# Patient Record
Sex: Female | Born: 1985 | Race: White | Hispanic: Yes | Marital: Married | Smoking: Never smoker
Health system: Southern US, Community
[De-identification: ages and names within clinical notes are randomized; demographics above are authoritative.]

## PROBLEM LIST (undated history)

## (undated) ENCOUNTER — Inpatient Hospital Stay (HOSPITAL_COMMUNITY): Payer: Self-pay

## (undated) DIAGNOSIS — Z789 Other specified health status: Secondary | ICD-10-CM

## (undated) DIAGNOSIS — R42 Dizziness and giddiness: Secondary | ICD-10-CM

## (undated) HISTORY — PX: NO PAST SURGERIES: SHX2092

## (undated) HISTORY — DX: Dizziness and giddiness: R42

---

## 2005-11-19 ENCOUNTER — Inpatient Hospital Stay (HOSPITAL_COMMUNITY): Admission: AD | Admit: 2005-11-19 | Discharge: 2005-11-19 | Payer: Self-pay | Admitting: Obstetrics and Gynecology

## 2005-11-19 ENCOUNTER — Inpatient Hospital Stay (HOSPITAL_COMMUNITY): Admission: AD | Admit: 2005-11-19 | Discharge: 2005-11-21 | Payer: Self-pay | Admitting: Obstetrics & Gynecology

## 2005-11-19 ENCOUNTER — Ambulatory Visit: Payer: Self-pay | Admitting: Obstetrics and Gynecology

## 2006-12-16 ENCOUNTER — Emergency Department (HOSPITAL_COMMUNITY): Admission: EM | Admit: 2006-12-16 | Discharge: 2006-12-16 | Payer: Self-pay | Admitting: Emergency Medicine

## 2007-06-07 ENCOUNTER — Emergency Department (HOSPITAL_COMMUNITY): Admission: EM | Admit: 2007-06-07 | Discharge: 2007-06-07 | Payer: Self-pay | Admitting: Emergency Medicine

## 2007-07-28 ENCOUNTER — Inpatient Hospital Stay (HOSPITAL_COMMUNITY): Admission: AD | Admit: 2007-07-28 | Discharge: 2007-07-28 | Payer: Self-pay | Admitting: Obstetrics & Gynecology

## 2007-08-12 ENCOUNTER — Ambulatory Visit (HOSPITAL_COMMUNITY): Admission: RE | Admit: 2007-08-12 | Discharge: 2007-08-12 | Payer: Self-pay | Admitting: Obstetrics & Gynecology

## 2007-08-14 ENCOUNTER — Ambulatory Visit: Payer: Self-pay | Admitting: Obstetrics & Gynecology

## 2008-05-29 ENCOUNTER — Emergency Department (HOSPITAL_COMMUNITY): Admission: EM | Admit: 2008-05-29 | Discharge: 2008-05-29 | Payer: Self-pay | Admitting: Emergency Medicine

## 2008-06-09 ENCOUNTER — Ambulatory Visit: Payer: Self-pay | Admitting: Obstetrics and Gynecology

## 2008-06-18 ENCOUNTER — Ambulatory Visit (HOSPITAL_COMMUNITY): Admission: RE | Admit: 2008-06-18 | Discharge: 2008-06-18 | Payer: Self-pay | Admitting: Family Medicine

## 2008-07-07 ENCOUNTER — Ambulatory Visit: Payer: Self-pay | Admitting: Obstetrics & Gynecology

## 2008-07-27 ENCOUNTER — Ambulatory Visit: Payer: Self-pay | Admitting: Obstetrics & Gynecology

## 2008-07-27 ENCOUNTER — Ambulatory Visit (HOSPITAL_COMMUNITY): Admission: RE | Admit: 2008-07-27 | Discharge: 2008-07-27 | Payer: Self-pay | Admitting: Obstetrics & Gynecology

## 2008-08-18 ENCOUNTER — Ambulatory Visit: Payer: Self-pay | Admitting: Obstetrics and Gynecology

## 2009-02-21 ENCOUNTER — Ambulatory Visit (HOSPITAL_COMMUNITY): Admission: RE | Admit: 2009-02-21 | Discharge: 2009-02-21 | Payer: Self-pay | Admitting: *Deleted

## 2009-03-03 ENCOUNTER — Inpatient Hospital Stay (HOSPITAL_COMMUNITY): Admission: AD | Admit: 2009-03-03 | Discharge: 2009-03-03 | Payer: Self-pay | Admitting: Obstetrics & Gynecology

## 2009-03-03 ENCOUNTER — Ambulatory Visit: Payer: Self-pay | Admitting: Advanced Practice Midwife

## 2009-03-17 ENCOUNTER — Ambulatory Visit (HOSPITAL_COMMUNITY): Admission: RE | Admit: 2009-03-17 | Discharge: 2009-03-17 | Payer: Self-pay | Admitting: Obstetrics & Gynecology

## 2009-07-15 ENCOUNTER — Ambulatory Visit: Payer: Self-pay | Admitting: Family Medicine

## 2009-07-15 ENCOUNTER — Inpatient Hospital Stay (HOSPITAL_COMMUNITY): Admission: AD | Admit: 2009-07-15 | Discharge: 2009-07-17 | Payer: Self-pay | Admitting: Obstetrics & Gynecology

## 2010-05-25 ENCOUNTER — Emergency Department (HOSPITAL_COMMUNITY)
Admission: EM | Admit: 2010-05-25 | Discharge: 2010-05-25 | Disposition: A | Discharge status: Home / Self Care | Payer: Self-pay | Attending: Emergency Medicine | Admitting: Emergency Medicine

## 2010-05-25 DIAGNOSIS — R51 Headache: Secondary | ICD-10-CM | POA: Insufficient documentation

## 2010-05-25 DIAGNOSIS — M542 Cervicalgia: Secondary | ICD-10-CM | POA: Insufficient documentation

## 2010-06-06 LAB — CBC
HCT: 40.1 % (ref 36.0–46.0)
MCHC: 34.1 g/dL (ref 30.0–36.0)
MCV: 88.1 fL (ref 78.0–100.0)
Platelets: 152 10*3/uL (ref 150–400)
RBC: 4.55 MIL/uL (ref 3.87–5.11)

## 2010-06-06 LAB — RPR: RPR Ser Ql: NONREACTIVE

## 2010-06-19 LAB — URINALYSIS, ROUTINE W REFLEX MICROSCOPIC
Bilirubin Urine: NEGATIVE
Hgb urine dipstick: NEGATIVE
Ketones, ur: NEGATIVE mg/dL
Nitrite: NEGATIVE
Specific Gravity, Urine: 1.01 (ref 1.005–1.030)
pH: 6.5 (ref 5.0–8.0)

## 2010-06-27 LAB — CBC
Hemoglobin: 14.4 g/dL (ref 12.0–15.0)
MCHC: 35.1 g/dL (ref 30.0–36.0)
MCV: 87.9 fL (ref 78.0–100.0)
RBC: 4.67 MIL/uL (ref 3.87–5.11)
RDW: 13.6 % (ref 11.5–15.5)

## 2010-06-29 LAB — COMPREHENSIVE METABOLIC PANEL
Albumin: 3.9 g/dL (ref 3.5–5.2)
Alkaline Phosphatase: 49 U/L (ref 39–117)
BUN: 11 mg/dL (ref 6–23)
CO2: 24 mEq/L (ref 19–32)
Chloride: 107 mEq/L (ref 96–112)
Creatinine, Ser: 0.71 mg/dL (ref 0.4–1.2)
GFR calc non Af Amer: >60 mL/min (ref >60)
Potassium: 3.3 mEq/L — ABNORMAL LOW (ref 3.5–5.1)
Total Bilirubin: 0.7 mg/dL (ref 0.3–1.2)

## 2010-06-29 LAB — CBC
HCT: 40.3 % (ref 36.0–46.0)
Hemoglobin: 13.9 g/dL (ref 12.0–15.0)
RBC: 4.64 MIL/uL (ref 3.87–5.11)
RDW: 13.2 % (ref 11.5–15.5)
WBC: 10.2 10*3/uL (ref 4.0–10.5)

## 2010-06-29 LAB — URINALYSIS, ROUTINE W REFLEX MICROSCOPIC
Bilirubin Urine: NEGATIVE
Glucose, UA: NEGATIVE mg/dL
Hgb urine dipstick: NEGATIVE
Ketones, ur: NEGATIVE mg/dL
Protein, ur: NEGATIVE mg/dL
pH: 6 (ref 5.0–8.0)

## 2010-06-29 LAB — POCT PREGNANCY, URINE

## 2010-06-29 LAB — URINE MICROSCOPIC-ADD ON

## 2010-06-29 LAB — DIFFERENTIAL
Basophils Absolute: 0.1 10*3/uL (ref 0.0–0.1)
Basophils Relative: 1 % (ref 0–1)
Eosinophils Absolute: 0 10*3/uL (ref 0.0–0.7)
Monocytes Absolute: 0.4 10*3/uL (ref 0.1–1.0)
Monocytes Relative: 4 % (ref 3–12)
Neutrophils Relative %: 91 % — ABNORMAL HIGH (ref 43–77)

## 2010-06-29 LAB — WET PREP, GENITAL
Clue Cells Wet Prep HPF POC: NONE SEEN
WBC, Wet Prep HPF POC: NONE SEEN

## 2010-06-29 LAB — GC/CHLAMYDIA PROBE AMP, GENITAL

## 2010-08-01 NOTE — Group Therapy Note (Signed)
[name], CUMBER NO.:  [id number]   MEDICAL RECORD NO.:  [id number]          PATIENT TYPE:  WOC   LOCATION:  [facility]                   FACILITY:  [facility]   PHYSICIAN:  Argentina Donovan, MD        DATE OF BIRTH:  1985-12-18   DATE OF SERVICE:                                  CLINIC NOTE   The patient is a 25 year old gravida 1, para 1-0-0-1, Spanish-speaking  Hispanic female, who recently underwent laparoscopy with fulguration of  endometriosis.  Dr. Silas Flood wanted her placed on Depo-Lupron.  We are going  to have her fill out the papers and apply for 11.25 injection of that.  Meanwhile, we are going to start her on Sprintec.  I explained to her  with the translator, the disease the history the following of the  disease, and progression, and what we must do to try and control this.  She seems to understand.  She is not planning a pregnancy right away, so  we are going to continue her on oral contraceptives, i.e. Sprintec.  The  papers were filled out today.  On examination, her abdomen is soft,  flat, and nontender.  No masses or organomegaly.  Postop, the umbilicus  is healing well with a slight tenderness and a small scar area, which  will take some time to soften.  She also has a complaint of bloating  following meals.  I told her if this continues, we will try and get her  to a gastroenterologist.   IMPRESSION:  Postop fulguration for endometriosis.   PLAN:  Depo-Lupron.           ______________________________  Argentina Donovan, MD     PR/MEDQ  D:  08/18/2008  T:  08/19/2008  Job:  [id number]

## 2010-08-01 NOTE — Group Therapy Note (Signed)
[name], [name] NO.:  [id number]   MEDICAL RECORD NO.:  [id number]          PATIENT TYPE:  WOC   LOCATION:  [facility]                   FACILITY:  [facility]   PHYSICIAN:  Allie Bossier, MD        DATE OF BIRTH:  [birth year]   DATE OF SERVICE:  08/14/2007                                  CLINIC NOTE   CHIEF COMPLAINT:  Pelvic pain, referred from [facility].   HISTORY OF PRESENT ILLNESS:  This is a 25 year old gravida 1, para 1-0-0-  1 presenting as a followup from the maternity admissions unit, where she  was seen for pelvic pain.  Her assessment there included ultrasound  showing a complex left ovarian cyst that had since resolved on repeat  ultrasound on Aug 12, 2007.  Her [facility] visit was on Jul 30, 2007.  At that  visit, they did gonorrhea and Chlamydia cultures that were negative, wet  prep that was negative.  CBC with white blood cell count of 9.6.  The  patient reports that she has had right lower quadrant suprapubic pain  that started three months ago.  When it started, she was doing normal  housework.  It progressively worsened, and that night when she was  having intercourse, her pain was worse.  She describes it as sharp in  nature and squeezing.  It is constant and has increasing and decreasing  intensity.  It worsens with movements such as bending and moving to that  side.  She states that also after sitting for a while it hurts.  She  notes that early on in the course of this pain, she had dysuria, but now  she is urinating normally.  She does not complain of increased  frequency.  She notes no changes in her bowel movements and no nausea or  vomiting.  She did have an IUD that was placed.  It was removed on May  13th when she was seen in the [facility]U.   PAST MEDICAL HISTORY:  None.   PAST SURGICAL HISTORY:  None.   PAST OB/GYN HISTORY:  Gravida 1 with history of one spontaneous vaginal  delivery at term.   GYNECOLOGIC HISTORY:  Patient reports no abnormal  Pap's.   MEDICATIONS:  Patient is taking hydrocodone/acetaminophen as needed for  pain.   ALLERGIES:  No known drug allergies.   PHYSICAL EXAMINATION:  GENERAL:  This is a well-appearing female in no  distress.  VITALS:  Temperature 97.9, pulse 73, blood pressure 107/70.  Weight 125  pounds.  CARDIOVASCULAR:  Heart has a regular rate and rhythm.  Normal S1 and S2.  No murmurs, rubs or gallops appreciated.  RESPIRATORY:  Lungs are clear to auscultation bilaterally.  ABDOMEN:  Soft.  There is tenderness in the right lower quadrant, left  lower quadrant, and suprapubic regions to palpation.  There is no  rebound tenderness or guarding.  With flexion of the abdominal muscles,  there continues to be tenderness to palpation of the muscles.  She is  also tender over the suprapubic symphysis.  GENITOURINARY:  Normal female external genitalia.  Vaginal mucosa is  pink  and moist with a small amount of whitish discharge.  Cervix is  midline with a small amount of bleeding noted.  Specimens are collected  for wet prep and gonorrhea and Chlamydia.  On placement of the  GC/Chlamydia cervical swab, there is cervical motion tenderness in all  directions, specifically anteriorly into the right.  There is mild pain.  The uterus feels normal in size.  The adnexa palpate normally but are  tender to palpation on the right.   ASSESSMENT/PLAN:  This is a 25 year old gravida 1, para 1-0-0-1 with  pelvic pain x3 months.  She has a ultrasound showing a resolved left  ovarian cyst.  We will check wet prep and GC/Chlamydia today.  UA today  is negative.  We will give her a trial of oral contraceptive pills and  Motrin every 6 hours around the clock.  She is to follow up in two  months to see how she is doing with this current intervention.   Patient was discussed with Dr. Marice Potter, and she agrees with the plan.     ______________________________  Karlton Lemon, MD    ______________________________  Allie Bossier, MD    NS/MEDQ  D:  08/14/2007  T:  08/14/2007  Job:  [id number]

## 2010-08-01 NOTE — Op Note (Signed)
[name], [name]             ACCOUNT NO.:  [id number]   MEDICAL RECORD NO.:  [id number]          PATIENT TYPE:  AMB   LOCATION:  [facility]                           [facility]   PHYSICIAN:  Norton Blizzard, MD    DATE OF BIRTH:  1985/11/10   DATE OF PROCEDURE:  07/27/2008  DATE OF DISCHARGE:  07/27/2008                               OPERATIVE REPORT   PREOPERATIVE DIAGNOSIS:  Chronic pelvic pain.   POSTOPERATIVE DIAGNOSES:  1. Chronic pelvic pain.  2. Stage I endometriosis.   PROCEDURES:  1. Operative laparoscopy.  2. Fulguration of endometriosis.   SURGEON:  Norton Blizzard, MD   ANESTHESIA:  General.   IV FLUIDS:  600 mL of lactated Ringer's.   URINE OUTPUT:  75 mL.   ESTIMATED BLOOD LOSS:  Minimal.   INDICATIONS:  The patient is a 25 year old gravida 1, para 1 with a long  history of chronic pelvic pain, which was refractory to treatment with  oral contraceptives and Depo-Provera.  The patient reported that her  pain was interfering with her activities of daily living and she wanted  evaluation for possible endometriosis. Prior to surgery, the risks of  surgery including but not limited to bleeding, infection, injury to  surrounding organs, need for additional procedures including laparotomy  were discussed with the patient and written informed consent was  obtained.   FINDINGS:  Stage I endometriotic lesions, purple/black lesions and  abnormal vasculature involving the bilateral uterosacral ligaments and  posterior cul-de-sac.  Normal adnexal regions bilaterally.  Normal  uterus.  Normal upper abdomen.   SPECIMENS:  None.   COMPLICATIONS:  None immediately after the case.   DESCRIPTION OF PROCEDURE:  The patient received preoperative Ancef and  has compression devices placed on her lower extremities while in the  preoperative area.  She was then taken to the operating room where  general anesthesia were administered and found to be adequate.  She was  then  placed in the dorsal lithotomy position, and prepped and draped in  a sterile manner.  A Foley catheter was inserted into the patient's  bladder and attached to constant gravity and an uterine manipulator was  also placed at this point.  Attention was then turned to the patient's  abdomen where an umbilical incision was made with a scalpel and a Veress  needle was inserted.  The correct intraperitoneal placement was  confirmed with an opening pressure of 2 mmHg upon insufflation with  carbon dioxide gas.  The abdomen was insufflated to 15 mmHg and the  Veress needle was removed and a 10-mm trocar was placed.  A laparoscope  was then placed, which also confirmed correct intraperitoneal placement.  A detailed survey of the upper abdomen and pelvis was done and was  remarkable for some endometriosis black and purple lesions noted on  bilateral uterosacral ligaments.  There was also abnormal vasculature  and some endometriotic lesions in the posterior cul-de-sac.  At this  point, the operative laparoscope was then placed and the decision was  made to proceed with fulguration of these endometriotic lesions.  Using  the Kleppinger forceps and bipolar coagulation these lesions were  fulgurated successfully with care given to avoid ureteral injury; good  fulguration forceps of all lesions were noted.  The Kleppinger forceps  were then removed from the abdomen and the abdomen was desufflated.  All  instruments were then removed from the patient's abdomen.  The umbilical  incision was repaired with 0 Vicryl figure-of-eight stitch for the  fascial layer and a 4-0 Vicryl subcuticular stitch for the skin layer.  All instruments were removed from the patient's pelvis.  The patient  tolerated procedure well.  Sponge, instrument and needle counts were  correct x2.  She was taken to the recovery room awake, extubated and in  stable condition.   FOLLOWUP PLANS:  The patient had a scheduled postoperative  appointment  on August 18, 2008 at 12:45 p.m.  This appointment was communicated to the  patient.  She was told to come to the emergency room here for any  postoperative concerns and was given prescriptions for Percocet,  Ibuprofen, and Colace.  When she comes for her postoperative visit, the  patient will be a good candidate for Depo-Lupron with addback therapy  for management of her chronic pelvic pain and her current diagnosis of  stage I endometriosis, also given that her pain has been refractory to  treatment with hormonal modalities including Depo-Provera and oral  contraceptive pills.      Norton Blizzard, MD  Electronically Signed     UAD/MEDQ  D:  07/27/2008  T:  07/28/2008  Job:  [id number]

## 2010-08-01 NOTE — Group Therapy Note (Signed)
[name], BEEBE NO.:  [id number]   MEDICAL RECORD NO.:  [id number]          PATIENT TYPE:  WOC   LOCATION:  [facility]                   FACILITY:  [facility]   PHYSICIAN:  Argentina Donovan, MD        DATE OF BIRTH:  12-03-1985   DATE OF SERVICE:  06/09/2008                                  CLINIC NOTE   The patient is a 25 year old gravida 1 para 1001, __________and one  child 1-1/2 years ago, who is complaining of pelvic pain.  [facility]  She was seen here a year ago and had  developed an ovarian cyst, probably hemorrhagic, and was followed up six  weeks later, and had resolved.  She was on oral contraceptives up until  her exam in the [facility] in January, when she was switched from  oral contraceptives to Depo-lupron because of discoloration of her face.  Other than that, she has been in good health.   PHYSICAL EXAMINATION:  VITAL SIGNS:  Blood pressure is 108/71.  Her  weight is 121.  Her height is 60-1/2 inches.  Pulse is 80.  ABDOMEN: Is soft, flat, tender to deep palpation, without guarding or  rebound.  When I push on the right lower quadrant, she complains of pain  on the left.  When I push on the upper right and left quadrant, she  complains of pain in the right pelvis.  PELVIS:  On bimanual examination, the uterus is small normal size,  shape, and consistency, and retroverted.  Adnexa could not be well  outlined.  No cyst could be palpated.  Cul-de-sac was free with  exception of the fundus of the uterus, and the patient will get an  ultrasound and come back in two weeks for evaluation.   IMPRESSION:  Pelvic pain of unknown etiology.           ______________________________  Argentina Donovan, MD     PR/MEDQ  D:  06/09/2008  T:  06/09/2008  Job:  [id number]

## 2010-08-01 NOTE — Group Therapy Note (Signed)
[name], THREATS NO.:  [id number]   MEDICAL RECORD NO.:  [id number]          PATIENT TYPE:  WOC   LOCATION:  [facility]                   FACILITY:  [facility]   PHYSICIAN:  Johnella Moloney, MD        DATE OF BIRTH:  20-Jun-1985   DATE OF SERVICE:                                  CLINIC NOTE   The patient is a 25 year old, gravida 1, para 1, with a long history of  chronic pelvic pain.  The patient was last seen by Dr. Argentina Donovan on  June 09, 2008, and at this point the patient was noting that her  chronic pelvic pain has gotten worse.  Her pain has been treated with  oral contraceptives which did not help her pain or constipation, and  also Depo-Provera which did not help her pain.  The patient is wanting  to discuss further management.  She did have a hemorrhagic ovarian cyst  a year ago but does say that the pain that she is having now is worse  compared to the pain she had then.  Her pain now is constant and is  interfering with her activities of daily living to the point that she  cannot give her daughter a shower or do most things without having a lot  of pain.  She also has some irregular bleeding that is connected to this  pain.  She describes having some bleeding around the middle of her cycle  like spotting and then having a heavy period at the end of her cycle  which is characterized by a lot of pain.   PAST OBSTETRIC AND GYNECOLOGIC HISTORY:  One spontaneous vaginal  delivery.  The patient denies any abnormal Pap smears.  Her last Pap was  April 06, 2008 and was normal.  She denies any sexually transmitted  infections.   PAST MEDICAL HISTORY:  None.   PAST SURGICAL HISTORY:  None.   MEDICATIONS:  1. Vicodin as needed for pain.  2. Ibuprofen as needed for pain.  3. Depo-Provera.  She was due for a shot on June 28, 2008, but the      patient has not received this, as it is not helping her pain.   ALLERGIES:  No known drug allergies.  The patient  is not allergic to  latex.   REVIEW OF SYSTEMS:  Only remarkable for abdominal pain, especially on  the right side.   PHYSICAL EXAMINATION:  VITAL SIGNS:  Temperature 98.3, pulse 81, blood  pressure 118/82, respirations 24, weight 123.2 pounds, height 60.5  inches.  GENERAL:  No apparent distress.  LUNGS:  Clear to auscultation bilaterally.  HEART:  Regular rate and rhythm.  ABDOMEN:  Soft.  Tenderness in the right lower quadrant, left lower  quadrant and suprapubic regions.  No rebound or guarding.  The patient  continues to have pain even with flexion of abdominal muscles and is  also tender to palpation in the right side when you push on the upper  right or left quadrants.  PELVIC:  Deferred at this visit.  EXTREMITIES:  No cyanosis, clubbing or edema.   ASSESSMENT/PLAN:  The patient is a 25 year old, gravida 1, para 1, with  chronic pelvic pain that is refractory to hormonal medications.  The  patient could have endometriosis.  She was told that she would need a  diagnostic laparoscopy in order to arrive at the diagnosis.  If  diagnostic laparoscopy is done and endometriosis is diagnosed, she will  be as good candidate for Depo-Lupron.  However, if there is no  endometriosis seen on the diagnosis laparoscopy, the patient's pain  could be musculoskeletal or neuropathic, and she will need to either go  to a pain clinic or a neurologist for further evaluation.  Of note, the  patient has no urinary or gastrointestinal symptoms that are concerning  for either interstitial cystitis or irritable bowel syndrome.  The risks  of laparoscopy were discussed with the patient including bleeding,  infection, injury to surrounding organs and need for additional  procedures.  The patient was told to expect a call from the surgical  scheduler regarding a date and time of her surgery.  In the meantime,  she was given a refill of her Vicodin and also given a prescription for  diclofenac DR 75 mg  p.o. b.i.d. p.r.n. pain.  She was told to come to  the emergency room if her pain is not controlled on these pain  medications or for any other gynecologic concerns.  The patient was  given [employer] pamphlets that discussed laparoscopy and endometriosis, and  these pamphlets were written in Spanish.  The patient is primarily  Spanish speaking, and a Spanish interpreter was present during this  encounter.           ______________________________  Johnella Moloney, MD     UD/MEDQ  D:  07/07/2008  T:  07/07/2008  Job:  [id number]

## 2010-12-11 LAB — WET PREP, GENITAL

## 2010-12-11 LAB — I-STAT 8, (EC8 V) (CONVERTED LAB)
Chloride: 106
HCT: 45
Hemoglobin: 15.3 — ABNORMAL HIGH
Operator id: 265201
Potassium: 3.7
TCO2: 27
pCO2, Ven: 45.5
pH, Ven: 7.365 — ABNORMAL HIGH

## 2010-12-11 LAB — POCT PREGNANCY, URINE
Operator id: 284251
Preg Test, Ur: NEGATIVE

## 2010-12-11 LAB — CBC
HCT: 42.2
Hemoglobin: 14.2
MCV: 86.1
RBC: 4.9
WBC: 9.3

## 2010-12-11 LAB — URINALYSIS, ROUTINE W REFLEX MICROSCOPIC
Ketones, ur: NEGATIVE
Nitrite: NEGATIVE
Protein, ur: NEGATIVE
Urobilinogen, UA: 0.2
pH: 7

## 2010-12-11 LAB — POCT I-STAT CREATININE
Creatinine, Ser: 0.7
Operator id: 265201

## 2010-12-11 LAB — DIFFERENTIAL
Eosinophils Absolute: 0
Eosinophils Relative: 0
Lymphocytes Relative: 18
Lymphs Abs: 1.6
Monocytes Relative: 5

## 2010-12-13 LAB — POCT URINALYSIS DIP (DEVICE)
Glucose, UA: NEGATIVE
Hgb urine dipstick: NEGATIVE
Nitrite: NEGATIVE
Protein, ur: NEGATIVE
Specific Gravity, Urine: 1.01
Urobilinogen, UA: 0.2

## 2012-06-05 ENCOUNTER — Emergency Department (HOSPITAL_COMMUNITY): Payer: Self-pay

## 2012-06-05 ENCOUNTER — Encounter (HOSPITAL_COMMUNITY): Payer: Self-pay | Admitting: Emergency Medicine

## 2012-06-05 ENCOUNTER — Emergency Department (HOSPITAL_COMMUNITY)
Admission: EM | Admit: 2012-06-05 | Discharge: 2012-06-05 | Disposition: A | Discharge status: Home / Self Care | Payer: Self-pay | Attending: Emergency Medicine | Admitting: Emergency Medicine

## 2012-06-05 DIAGNOSIS — M4120 Other idiopathic scoliosis, site unspecified: Secondary | ICD-10-CM | POA: Insufficient documentation

## 2012-06-05 DIAGNOSIS — R109 Unspecified abdominal pain: Secondary | ICD-10-CM | POA: Insufficient documentation

## 2012-06-05 DIAGNOSIS — R300 Dysuria: Secondary | ICD-10-CM | POA: Insufficient documentation

## 2012-06-05 DIAGNOSIS — M549 Dorsalgia, unspecified: Secondary | ICD-10-CM | POA: Insufficient documentation

## 2012-06-05 DIAGNOSIS — R5381 Other malaise: Secondary | ICD-10-CM | POA: Insufficient documentation

## 2012-06-05 LAB — URINALYSIS, ROUTINE W REFLEX MICROSCOPIC
Bilirubin Urine: NEGATIVE
Glucose, UA: NEGATIVE mg/dL
Hgb urine dipstick: NEGATIVE
Ketones, ur: NEGATIVE mg/dL
Protein, ur: NEGATIVE mg/dL
Urobilinogen, UA: 0.2 mg/dL (ref 0.0–1.0)

## 2012-06-05 LAB — URINE MICROSCOPIC-ADD ON

## 2012-06-05 MED ORDER — IBUPROFEN 800 MG PO TABS: 800.0000 mg | ORAL_TABLET | Freq: Three times a day (TID) | ORAL | Status: DC | PRN

## 2012-06-05 NOTE — ED Notes (Signed)
Patient complains of R flank pain.   Patient states she has had pain off and on x 2 months.  Patient states no nausea or vomiting.  Patient denies problems with urination.

## 2012-06-05 NOTE — ED Provider Notes (Signed)
Medical screening examination/treatment/procedure(s) were performed by non-physician practitioner and as supervising physician I was immediately available for consultation/collaboration.  Mackensi Mahadeo, MD 06/05/12 1710 

## 2012-06-05 NOTE — ED Provider Notes (Signed)
History     CSN: [id number]  Arrival date & time 06/05/12  1191   First MD Initiated Contact with Patient 06/05/12 1106      Chief Complaint  Patient presents with  . Flank Pain    Right    (Consider location/radiation/quality/duration/timing/severity/associated sxs/prior treatment) HPI Comments: Patient reports she has had right back pain that began 1 year ago, was initially intermittent, but has been constant for the past three months.  Pain is described as pressure and occasional burning.  Radiates up and down.  Exacerbated by physical activity and staying in one position too long.  The pain initially began 1 year go after the birth of her baby.  Reports she gets tired easily and has to rest.  When the pain is bad it also causes her right arm to hurt.  Has occasional dysuria and abnormally colored urine, not for the past two weeks.  Pt does admit to carrying her two children only with right arm and on right side of body.  Denies fevers, abnormal weight loss, night sweats.  Denies neck pain.  No numbness or weakness of the extremities.    The history is provided by the patient.    History reviewed. No pertinent past medical history.  History reviewed. No pertinent past surgical history.  No family history on file.  History  Substance Use Topics  . Smoking status: Never Smoker   . Smokeless tobacco: Not on file  . Alcohol Use: No    OB History   Grav Para Term Preterm Abortions TAB SAB Ect Mult Living                  Review of Systems  Constitutional: Positive for fatigue. Negative for fever, chills and unexpected weight change.  Respiratory: Negative for shortness of breath.   Cardiovascular: Negative for chest pain.  Gastrointestinal: Negative for nausea, vomiting, abdominal pain and diarrhea.  Genitourinary: Negative for dysuria, urgency and frequency.  Musculoskeletal: Positive for back pain.  Neurological: Negative for weakness and numbness.    Allergies    Review of patient's allergies indicates no known allergies.  Home Medications  No current outpatient prescriptions on file.  BP 109/74  Pulse 78  Temp(Src) 98.3 F (36.8 C) (Oral)  Resp 18  SpO2 99%  Physical Exam  Nursing note and vitals reviewed. Constitutional: She appears well-developed and well-nourished. No distress.  HENT:  Head: Normocephalic and atraumatic.  Neck: Neck supple.  Cardiovascular: Normal rate and regular rhythm.   Pulmonary/Chest: Effort normal and breath sounds normal. No respiratory distress. She has no wheezes. She has no rales.    Right back tenderness reproduces pain.   Abdominal: Soft. She exhibits no distension. There is tenderness. There is no rebound and no guarding.  Very mild diffuse abdominal tenderness  Neurological: She is alert. Gait normal.  Skin: She is not diaphoretic.    ED Course  Procedures (including critical care time)  Labs Reviewed  URINALYSIS, ROUTINE W REFLEX MICROSCOPIC - Abnormal; Notable for the following:    APPearance CLOUDY (*)    Leukocytes, UA TRACE (*)    All other components within normal limits  URINE MICROSCOPIC-ADD ON - Abnormal; Notable for the following:    Squamous Epithelial / LPF MANY (*)    All other components within normal limits   Dg Chest 2 View  06/05/2012  *RADIOLOGY REPORT*  Clinical Data: Periodic right flank pain. Shortness of breath with activity.  CHEST - 2 VIEW  Comparison: None.  Findings: Dextroscoliosis thoracic spine.  Probable prominent right nipple shadow.  This can be confirmed with nipple marker view.  No infiltrate, congestive heart failure or pneumothorax.  Heart size within normal limits.  IMPRESSION: Dextroscoliosis thoracic spine.  Probable prominent right nipple shadow.  This can be confirmed with nipple marker view.  No infiltrate, congestive heart failure or pneumothorax.   Original Report Authenticated By: Lacy Duverney, M.D.      1. Back pain   2. Scoliosis      MDM   Pt with chronic with sided back pain, reproducible by palpation.  No bony tenderness.  No neurological deficits.  Doubt spinal pathology.  UA does not show infection.  CXR significant only for scoliosis, which patient was not aware she had.  Likely musculoskeletal pain. Pt does carry her two children on the right side of her body, which may be causing or exacerbating problem.  Will give NSAIDs and PCP follow up.  Discussed all results with patient.  Pt given return precautions.  Pt verbalizes understanding and agrees with plan.          Trixie Dredge, PA-C 06/05/12 1327

## 2013-03-19 NOTE — L&D Delivery Note (Signed)
Delivery Note At 8:14 PM a viable female was delivered via  (Presentation: ;  ).  APGAR: , ; weight .   Placenta status: , .  Cord:  with the following complications: .  Cord pH: not done  Anesthesia:   Episiotomy:  Lacerations:  Suture Repair: 2.0 Est. Blood Loss (mL):   Mom to postpartum.  Baby to Couplet care / Skin to Skin.  Narmeen Kerper A 12/26/2013, 8:21 PM

## 2013-05-28 LAB — OB RESULTS CONSOLE GC/CHLAMYDIA
Chlamydia: NEGATIVE
Gonorrhea: NEGATIVE

## 2013-05-28 LAB — OB RESULTS CONSOLE ANTIBODY SCREEN: ANTIBODY SCREEN: NEGATIVE

## 2013-05-28 LAB — OB RESULTS CONSOLE HEPATITIS B SURFACE ANTIGEN: Hepatitis B Surface Ag: NEGATIVE

## 2013-05-28 LAB — OB RESULTS CONSOLE RPR: RPR: NONREACTIVE

## 2013-05-28 LAB — OB RESULTS CONSOLE ABO/RH: RH TYPE: POSITIVE

## 2013-05-28 LAB — OB RESULTS CONSOLE HIV ANTIBODY (ROUTINE TESTING): HIV: NONREACTIVE

## 2013-05-28 LAB — OB RESULTS CONSOLE RUBELLA ANTIBODY, IGM: RUBELLA: IMMUNE

## 2013-08-25 ENCOUNTER — Encounter (HOSPITAL_COMMUNITY): Payer: Self-pay | Admitting: *Deleted

## 2013-08-25 ENCOUNTER — Inpatient Hospital Stay (HOSPITAL_COMMUNITY)
Admission: AD | Admit: 2013-08-25 | Discharge: 2013-08-25 | Disposition: A | Discharge status: Home / Self Care | Payer: Self-pay | Source: Ambulatory Visit | Attending: Obstetrics | Admitting: Obstetrics

## 2013-08-25 DIAGNOSIS — E119 Type 2 diabetes mellitus without complications: Secondary | ICD-10-CM | POA: Insufficient documentation

## 2013-08-25 DIAGNOSIS — H9201 Otalgia, right ear: Secondary | ICD-10-CM

## 2013-08-25 DIAGNOSIS — H6690 Otitis media, unspecified, unspecified ear: Secondary | ICD-10-CM | POA: Insufficient documentation

## 2013-08-25 DIAGNOSIS — N390 Urinary tract infection, site not specified: Secondary | ICD-10-CM

## 2013-08-25 DIAGNOSIS — O9989 Other specified diseases and conditions complicating pregnancy, childbirth and the puerperium: Principal | ICD-10-CM

## 2013-08-25 DIAGNOSIS — H9209 Otalgia, unspecified ear: Secondary | ICD-10-CM

## 2013-08-25 DIAGNOSIS — J069 Acute upper respiratory infection, unspecified: Secondary | ICD-10-CM | POA: Insufficient documentation

## 2013-08-25 DIAGNOSIS — O99891 Other specified diseases and conditions complicating pregnancy: Secondary | ICD-10-CM | POA: Insufficient documentation

## 2013-08-25 HISTORY — DX: Other specified health status: Z78.9

## 2013-08-25 LAB — RAPID STREP SCREEN (MED CTR MEBANE ONLY): Streptococcus, Group A Screen (Direct): NEGATIVE

## 2013-08-25 MED ORDER — FLUCONAZOLE 150 MG PO TABS: ORAL_TABLET | ORAL | Status: DC

## 2013-08-25 MED ORDER — DM-GUAIFENESIN ER 30-600 MG PO TB12: 1.0000 | ORAL_TABLET | Freq: Two times a day (BID) | ORAL | Status: DC

## 2013-08-25 MED ORDER — ACETAMINOPHEN 500 MG PO TABS
1000.0000 mg | ORAL_TABLET | Freq: Once | ORAL | Status: AC
  Administered 2013-08-25: 1000 mg via ORAL
  Filled 2013-08-25: qty 2

## 2013-08-25 MED ORDER — AMOXICILLIN 500 MG PO CAPS: 500.0000 mg | ORAL_CAPSULE | Freq: Three times a day (TID) | ORAL | Status: DC

## 2013-08-25 MED ORDER — DM-GUAIFENESIN ER 30-600 MG PO TB12
1.0000 | ORAL_TABLET | Freq: Two times a day (BID) | ORAL | Status: DC
  Administered 2013-08-25: 1 via ORAL
  Filled 2013-08-25 (×3): qty 1

## 2013-08-25 NOTE — MAU Provider Note (Signed)
  History     CSN: [id number]  Arrival date and time: 08/25/13 1321   First Provider Initiated Contact with Patient 08/25/13 1350      Chief Complaint  Patient presents with  . Sore Throat  . Otalgia   HPI Pt is [redacted]w[redacted]d pregnant, pt of Dr. Elsie Stain, who presents with headache, ear ache, sore throat with dry cough with  Chest pain.  Pt feels sinus congestion. Pt has laryngitis.  Pt denies nausea or vomiting.  Pt has not taken any medications For the sx.  Pt called Dr. Elsie Stain office this morning and they told pt to come to MAU.  Pt has had sx of pain On Sat and Sunday started with laryngitis And right ear pain started last night.  Pt has not run a fever.   Past Medical History  Diagnosis Date  . Medical history non-contributory     Past Surgical History  Procedure Laterality Date  . No past surgeries      Family History  Problem Relation Age of Onset  . Alcohol abuse Neg Hx     History  Substance Use Topics  . Smoking status: Never Smoker   . Smokeless tobacco: Not on file  . Alcohol Use: No    Allergies: No Known Allergies  Prescriptions prior to admission  Medication Sig Dispense Refill  . Prenatal Vit-Fe Fumarate-FA (PRENATAL MULTIVITAMIN) TABS tablet Take 1 tablet by mouth daily at 12 noon.        Review of Systems  Constitutional: Negative for fever and chills.  HENT: Positive for congestion, ear pain and sore throat. Negative for ear discharge and nosebleeds.   Respiratory: Positive for cough. Negative for hemoptysis and sputum production.   Gastrointestinal: Negative for nausea, vomiting and abdominal pain.   Physical Exam   Blood pressure 111/62, pulse 90, temperature 98.2 F (36.8 C), resp. rate 18, height 5\' 1"  (1.549 m), weight 141 lb 12.8 oz (64.32 kg), SpO2 100.00%.  Physical Exam  Nursing note and vitals reviewed. Constitutional: She is oriented to person, place, and time. She appears well-developed and well-nourished. No distress.  HENT:   Head: Normocephalic.  Some inflammation of pharynx- no exudate; tender submandibular nodes with slight enlargement.  Right ear canal reddened- tympanic membrane gray retracted;  Tender mastoid; left ear canal slight reddened, typanic membrane normal appear with light reflex. No sinus tenderness  Eyes: Pupils are equal, round, and reactive to light.  Neck: Normal range of motion.  Cardiovascular: Normal rate.   Respiratory: Effort normal and breath sounds normal.  Musculoskeletal: Normal range of motion.  Neurological: She is alert and oriented to person, place, and time.  Skin: Skin is warm and dry.    MAU Course  Procedures Mucinex DM given Strep throat rapid screen No results found for this or any previous visit (from the past 24 hour(s)). Results for orders placed during the hospital encounter of 08/25/13 (from the past 24 hour(s))  RAPID STREP SCREEN     Status: None   Collection Time    08/25/13  2:10 PM      Result Value Ref Range   Streptococcus, Group A Screen (Direct) NEGATIVE  NEGATIVE   Strep throat results pending Assessment and Plan  Otitis- Amoxicillin 500mg  TID for 7 days URI- Mucinex DM Tylenol, increase fluids Diflucan if needed for yeast F/u with Dr. Fulton Reek [name] 08/25/2013, 1:50 PM

## 2013-08-25 NOTE — MAU Note (Signed)
Sore throat and R ear pain since SUnday. Chest hurts when coughs but does not cough up anything. Denies any bleeding, d/c, leaking fld, abd pain.

## 2013-08-25 NOTE — Progress Notes (Signed)
Wende Bushy Np in to see pt and discuss d/c plan. Written and verbal d/c instructions given and understanding voiced

## 2013-08-27 LAB — CULTURE, GROUP A STREP

## 2013-11-26 ENCOUNTER — Observation Stay (HOSPITAL_COMMUNITY)
Admission: AD | Admit: 2013-11-26 | Discharge: 2013-11-27 | Disposition: A | Discharge status: Home / Self Care | Payer: Self-pay | Source: Ambulatory Visit | Attending: Obstetrics | Admitting: Obstetrics

## 2013-11-26 ENCOUNTER — Inpatient Hospital Stay (HOSPITAL_COMMUNITY): Payer: Self-pay

## 2013-11-26 ENCOUNTER — Encounter (HOSPITAL_COMMUNITY): Payer: Self-pay | Admitting: *Deleted

## 2013-11-26 DIAGNOSIS — O99891 Other specified diseases and conditions complicating pregnancy: Principal | ICD-10-CM | POA: Insufficient documentation

## 2013-11-26 DIAGNOSIS — N949 Unspecified condition associated with female genital organs and menstrual cycle: Secondary | ICD-10-CM | POA: Insufficient documentation

## 2013-11-26 DIAGNOSIS — O9989 Other specified diseases and conditions complicating pregnancy, childbirth and the puerperium: Principal | ICD-10-CM

## 2013-11-26 DIAGNOSIS — W19XXXA Unspecified fall, initial encounter: Secondary | ICD-10-CM | POA: Diagnosis present

## 2013-11-26 DIAGNOSIS — Y929 Unspecified place or not applicable: Secondary | ICD-10-CM | POA: Insufficient documentation

## 2013-11-26 DIAGNOSIS — W010XXA Fall on same level from slipping, tripping and stumbling without subsequent striking against object, initial encounter: Secondary | ICD-10-CM | POA: Insufficient documentation

## 2013-11-26 MED ORDER — OXYCODONE-ACETAMINOPHEN 5-325 MG PO TABS
1.0000 | ORAL_TABLET | Freq: Once | ORAL | Status: AC
  Administered 2013-11-26: 1 via ORAL
  Filled 2013-11-26: qty 1

## 2013-11-26 MED ORDER — LACTATED RINGERS IV BOLUS (SEPSIS)
1000.0000 mL | Freq: Once | INTRAVENOUS | Status: AC
  Administered 2013-11-26: 1000 mL via INTRAVENOUS

## 2013-11-26 MED ORDER — MORPHINE SULFATE 4 MG/ML IJ SOLN
2.0000 mg | Freq: Once | INTRAMUSCULAR | Status: AC
  Administered 2013-11-26: 2 mg via INTRAVENOUS
  Filled 2013-11-26: qty 1

## 2013-11-26 MED ORDER — PROMETHAZINE HCL 25 MG PO TABS
25.0000 mg | ORAL_TABLET | Freq: Once | ORAL | Status: AC
  Administered 2013-11-26: 25 mg via ORAL
  Filled 2013-11-26: qty 1

## 2013-11-26 NOTE — MAU Note (Signed)
Brought in by EMS ; pt almost fell at 1930 but her husband caught her; she c/o pain underneath her abdomen;

## 2013-11-26 NOTE — MAU Provider Note (Signed)
  History     CSN: [id number]  Arrival date and time: 11/26/13 2004   None     Chief Complaint  Patient presents with  . Fall   HPI Comments: Infantof Villalta 28 y.o. [id number] [redacted]w[redacted]d presents to MAU tonight after a fall. She actually did the splits due to slipping in the rain. She hit her right knee with is painful as well as her groin and pelvic area. She describes her pain as 9-10 out of 10. She has good fetal movement. Denies LOF or vaginal bleeding.   Fall   This is a 28 yo G3P2002 at 35wk6d by unknown dated ultrasound who presents to MAU for evaluation after a fall episode. She states she was walking in [employer] and slipped and fell. She saved her fall by hitting the floor on her right knee and right hand. Denies any injury to her abdomen, back, buttocks, or hip region. She denies any contractions, vaginal bleeding, or leakage of fluid. She reports good fetal movement.    Past Medical History  Diagnosis Date  . Medical history non-contributory     Past Surgical History  Procedure Laterality Date  . No past surgeries      Family History  Problem Relation Age of Onset  . Alcohol abuse Neg Hx     History  Substance Use Topics  . Smoking status: Never Smoker   . Smokeless tobacco: Not on file  . Alcohol Use: No    Allergies: No Known Allergies  Prescriptions prior to admission  Medication Sig Dispense Refill  . amoxicillin (AMOXIL) 500 MG capsule Take 1 capsule (500 mg total) by mouth 3 (three) times daily.  21 capsule  0  . dextromethorphan-guaiFENesin (MUCINEX DM) 30-600 MG per 12 hr tablet Take 1 tablet by mouth 2 (two) times daily.  20 tablet  0  . fluconazole (DIFLUCAN) 150 MG tablet Take one an onset of yeast infection and repeat in 3 days if symptoms persist  2 tablet  3  . Prenatal Vit-Fe Fumarate-FA (PRENATAL MULTIVITAMIN) TABS tablet Take 1 tablet by mouth daily at 12 noon.        Review of Systems  Musculoskeletal: Positive for joint pain.   Right knee pain, pelvic pain, groin pain   Physical Exam   There were no vitals taken for this visit.  Physical Exam  Constitutional: She is oriented to person, place, and time. She appears well-developed. She appears distressed.  HENT:  Head: Normocephalic.  Eyes: Conjunctivae and EOM are normal.  Cardiovascular: Normal rate and regular rhythm.   Respiratory: Effort normal and breath sounds normal. No respiratory distress.  GI: Soft. Bowel sounds are normal. There is tenderness.  No fundal tenderness, pain with palpation over suprapubic region, no distention  Cervix closed   Musculoskeletal: She exhibits tenderness. She exhibits no edema.  Decreased ROM of RLE secondary to pain, ecchymosis noted over right knee.   Neurological: She is alert and oriented to person, place, and time.  Skin: Skin is warm and dry.   FHTs: baseline heart rate of 140s, moderate variability, +accelerations, no decelerations, category I strip.  TOCO: contractions q2-9 minutes   Percocet/phenergan/ Morphine and dilaudid for pain/ now down to 5/10 Spoke with Dr Clearance Coots who would like to admit her for pain control  MAU Course  Procedures Assessment and Plan   A: Pelvic Pain following fall Pregnancy  P; Will admit to antenatal for pain control

## 2013-11-27 ENCOUNTER — Encounter (HOSPITAL_COMMUNITY): Payer: Self-pay | Admitting: *Deleted

## 2013-11-27 DIAGNOSIS — W19XXXA Unspecified fall, initial encounter: Secondary | ICD-10-CM | POA: Diagnosis present

## 2013-11-27 LAB — KLEIHAUER-BETKE STAIN
# VIALS RHIG: 1
Fetal Cells %: 0 %
QUANTITATION FETAL HEMOGLOBIN: 0 mL

## 2013-11-27 MED ORDER — CYCLOBENZAPRINE HCL 10 MG PO TABS
10.0000 mg | ORAL_TABLET | Freq: Once | ORAL | Status: AC
  Administered 2013-11-27: 10 mg via ORAL
  Filled 2013-11-27: qty 1

## 2013-11-27 MED ORDER — ZOLPIDEM TARTRATE 5 MG PO TABS
5.0000 mg | ORAL_TABLET | Freq: Every evening | ORAL | Status: DC | PRN
  Administered 2013-11-27: 5 mg via ORAL
  Filled 2013-11-27: qty 1

## 2013-11-27 MED ORDER — LACTATED RINGERS IV SOLN
INTRAVENOUS | Status: DC
  Administered 2013-11-27: 06:00:00 via INTRAVENOUS

## 2013-11-27 MED ORDER — PRENATAL MULTIVITAMIN CH: 1.0000 | ORAL_TABLET | Freq: Every day | ORAL | Status: DC

## 2013-11-27 MED ORDER — HYDROMORPHONE HCL 2 MG PO TABS
4.0000 mg | ORAL_TABLET | Freq: Once | ORAL | Status: AC
  Administered 2013-11-27: 4 mg via ORAL
  Filled 2013-11-27: qty 2

## 2013-11-27 MED ORDER — CYCLOBENZAPRINE HCL 10 MG PO TABS
10.0000 mg | ORAL_TABLET | Freq: Three times a day (TID) | ORAL | Status: DC
  Filled 2013-11-27: qty 1

## 2013-11-27 MED ORDER — HYDROMORPHONE HCL PF 1 MG/ML IJ SOLN
1.0000 mg | Freq: Once | INTRAMUSCULAR | Status: AC
  Administered 2013-11-27: 1 mg via INTRAVENOUS
  Filled 2013-11-27: qty 1

## 2013-11-27 NOTE — Progress Notes (Signed)
Patient ID: [name], female   DOB: 1985/04/12, 28 y.o.   MRN: [id number] Vital signs normal Patient's pain is much less and she can ambulate without difficulty she discharge today on Percocet for pain to see me at her next scheduled visit

## 2013-11-27 NOTE — Discharge Instructions (Signed)
Discharge instructions   You can wash your hair  Shower  Eat what you want  Drink what you want  See me in 6 weeks  Your ankles are going to swell more in the next 2 weeks than when pregnant  No sex for 6 weeks   Bennie Scaff A, MD 11/27/2013

## 2013-11-27 NOTE — H&P (Signed)
[name] is Arnold 28 y.o. female presenting for pain mnagement after Arnold fall.  Patient slipped on floor and fell, sustaining severe pelvic pain afterwards.. Maternal Medical History:  Fetal activity: Perceived fetal activity is normal.   Last perceived fetal movement was within the past hour.    Prenatal Complications - Diabetes: none.    OB History   Grav Para Term Preterm Abortions TAB SAB Ect Mult Living   0 0 0 0 0 0 2     Past Medical History  Diagnosis Date  . Medical history non-contributory    Past Surgical History  Procedure Laterality Date  . No past surgeries     Family History: family history is negative for Alcohol abuse. Social History:  reports that she has never smoked. She does not have any smokeless tobacco history on file. She reports that she does not drink alcohol or use illicit drugs.   Prenatal Transfer Tool  Maternal Diabetes: No Genetic Screening: Declined Maternal Ultrasounds/Referrals: Normal Fetal Ultrasounds or other Referrals:  None Maternal Substance Abuse:  No Significant Maternal Medications:  None Significant Maternal Lab Results:  None Other Comments:  None  Review of Systems  Musculoskeletal: Positive for joint pain.  All other systems reviewed and are negative.   Dilation: Fingertip Effacement (%): Thick Station: Ballotable Exam by:: B Mosca Blood pressure 94/56, pulse 84, temperature 98.1 F (36.7 C), resp. rate 20. Maternal Exam:  Abdomen: Patient reports no abdominal tenderness.   Physical Exam  Constitutional: She is oriented to person, place, and time. She appears well-developed and well-nourished.  HENT:  Head: Normocephalic and atraumatic.  Eyes: Conjunctivae are normal. Pupils are equal, round, and reactive to light.  Neck: Normal range of motion. Neck supple.  Cardiovascular: Normal rate and regular rhythm.   Respiratory: Effort normal and breath sounds normal.  GI: Soft.  Neurological: She is alert and  oriented to person, place, and time.  Skin: Skin is warm and dry.  Psychiatric: She has Arnold normal mood and affect. Her behavior is normal. Judgment and thought content normal.    Prenatal labs: ABO, Rh:   Antibody:   Rubella:   RPR:    HBsAg:    HIV:    GBS:     Assessment/Plan: 36 weeks.  Severe pelvic pain after Arnold fall.  Possible pelvic symphysis separation.  Admit for pain management.   [name] 11/27/2013, 2:14 AM

## 2013-11-27 NOTE — Discharge Summary (Signed)
  Patient was admitted for stat because she fell home and was having severe pain she's not having any contractions she was monitored throughout the night and they look fine she had x-rays of her femur on her pelvis and she had no fractures this morning patient feels much better and she been discharged to see me in one week

## 2013-11-28 LAB — OB RESULTS CONSOLE GBS: GBS: NEGATIVE

## 2013-12-21 ENCOUNTER — Encounter (HOSPITAL_COMMUNITY): Payer: Self-pay | Admitting: *Deleted

## 2013-12-21 ENCOUNTER — Inpatient Hospital Stay (HOSPITAL_COMMUNITY)
Admission: AD | Admit: 2013-12-21 | Discharge: 2013-12-21 | Disposition: A | Discharge status: Home / Self Care | Payer: MEDICAID | Source: Ambulatory Visit | Attending: Obstetrics | Admitting: Obstetrics

## 2013-12-21 DIAGNOSIS — R079 Chest pain, unspecified: Secondary | ICD-10-CM | POA: Insufficient documentation

## 2013-12-21 DIAGNOSIS — R0602 Shortness of breath: Secondary | ICD-10-CM | POA: Insufficient documentation

## 2013-12-21 DIAGNOSIS — O26893 Other specified pregnancy related conditions, third trimester: Secondary | ICD-10-CM

## 2013-12-21 DIAGNOSIS — O9989 Other specified diseases and conditions complicating pregnancy, childbirth and the puerperium: Secondary | ICD-10-CM | POA: Insufficient documentation

## 2013-12-21 DIAGNOSIS — R12 Heartburn: Secondary | ICD-10-CM

## 2013-12-21 MED ORDER — RANITIDINE HCL 150 MG PO TABS: 150.0000 mg | ORAL_TABLET | Freq: Two times a day (BID) | ORAL | Status: DC

## 2013-12-21 MED ORDER — GI COCKTAIL ~~LOC~~
30.0000 mL | ORAL | Status: AC
  Administered 2013-12-21: 30 mL via ORAL
  Filled 2013-12-21: qty 30

## 2013-12-21 NOTE — MAU Note (Signed)
Pt reports upper chest pain for the last 2 days off/on. Started again at about 1730, and it is worsening. States it feels like the baby is too high and it makes her feel like she can't breathe.

## 2013-12-21 NOTE — MAU Provider Note (Signed)
Chief Complaint:  Chest Pain and Shortness of Breath   First Provider Initiated Contact with Patient 12/21/13 2016      HPI: [name] is a 28 y.o. G3P2002 at 89w3dwho presents to maternity admissions reporting burning chest pain and pressure making it hard to take a deep breath.  She reports the pain started 2 days ago but has worsened today.  She has had heartburn during the pregnancy but never pain like this.  She denies cardiac history.  She reports good fetal movement, denies regular contractions, LOF, vaginal bleeding, vaginal itching/burning, urinary symptoms, h/a, dizziness, n/v, or fever/chills.  . Denies contractions, leakage of fluid or vaginal bleeding. Good fetal movement.   Pregnancy Course:   Past Medical History: Past Medical History  Diagnosis Date  . Medical history non-contributory     Past obstetric history: OB History  Gravida Para Term Preterm AB SAB TAB Ectopic Multiple Living  3 2 2  0 0 0 0 0 0 2    # Outcome Date GA Lbr Len/2nd Weight Sex Delivery Anes PTL Lv  3 CUR           2 TRM           1 TRM               Past Surgical History: Past Surgical History  Procedure Laterality Date  . No past surgeries      Family History: Family History  Problem Relation Age of Onset  . Alcohol abuse Neg Hx     Social History: History  Substance Use Topics  . Smoking status: Never Smoker   . Smokeless tobacco: Not on file  . Alcohol Use: No    Allergies: No Known Allergies  Meds:  Prescriptions prior to admission  Medication Sig Dispense Refill  . Prenatal Vit-Fe Fumarate-FA (PRENATAL MULTIVITAMIN) TABS tablet Take 1 tablet by mouth daily at 12 noon.        ROS: Pertinent findings in history of present illness.  Physical Exam  Blood pressure 125/74, pulse 101, temperature 98.2 F (36.8 C), temperature source Oral, resp. rate 20, height 5\' 1"  (1.549 m), weight 70.761 kg (156 lb), SpO2 100.00%. GENERAL: Well-developed, well-nourished female in  no acute distress.  HEENT: normocephalic HEART: normal rate RESP: normal effort ABDOMEN: Soft, non-tender, gravid appropriate for gestational age EXTREMITIES: Nontender, no edema NEURO: alert and oriented SPECULUM EXAM: NEFG, physiologic discharge, no blood, cervix clean    FHT:  Baseline 130, moderate variability, accelerations present, no decelerations Contractions: None on toco or to palpation   Labs: No results found for this or any previous visit (from the past 24 hour(s)).   ED Course GI Cocktail given with complete relief of symptoms  Assessment: 1. Heartburn during pregnancy, third trimester     Plan: Discharge home Labor precautions and fetal kick counts Zantac 150 mg BID      Follow-up Information   Follow up with Kathreen Cosier, MD. (As scheduled)    Specialty:  Obstetrics and Gynecology   Contact information:   [address] [place] [address] [phone]       Follow up with [facility] [place] MATERNITY ADMISSIONS. (As needed for emergencies or signs of labor)    Contact information:   [address] [id number] [place] [address] [phone]       Medication List         prenatal multivitamin Tabs tablet  Take 1 tablet by mouth daily at  12 noon.     ranitidine 150 MG tablet  Commonly known as:  ZANTAC  Take 1 tablet (150 mg total) by mouth 2 (two) times daily.        Sharen CounterLisa Leftwich-Kirby Certified Nurse-Midwife 12/21/2013 9:29 PM

## 2013-12-21 NOTE — MAU Note (Signed)
CNM listening to HR and lungs.

## 2013-12-21 NOTE — MAU Note (Signed)
Pt states that her burning and pain in throat is not there anymore.

## 2013-12-21 NOTE — Discharge Instructions (Signed)
Reasons to return to MAU:  1.  Contractions are  5 minutes apart or less, each last 1 minute, these have been going on for 1-2 hours, and you cannot walk or talk during them 2.  You have a large gush of fluid, or a trickle of fluid that will not stop and you have to wear a pad 3.  You have bleeding that is bright red, heavier than spotting--like menstrual bleeding (spotting can be normal in early labor or after a check of your cervix) 4.  You do not feel the baby moving like he/she normally does  Acidez de Optometristestmago durante el embarazo  (Heartburn During Pregnancy ) La acidez es la sensacin de ardor en el pecho que se siente cuando el cido del estmago vuelve haca el esfago. La acidez es frecuente en el embarazo debido a la liberacin de cierta hormona (progesterona). La progesterona relaja la vlvula que separa el esfago del Reynoldsestmago. Esto hace que el cido suba al esfago y cause acidez. Tambin puede ocurrir Visual merchandiseren el embarazo debido a que el tero al agrandarse empuja el estmago, lo que hace que suba ms cido al esfago. Esto se produce especialmente en las ltimas etapas del embarazo. La acidez generalmente desaparece despus del parto. CAUSAS  La acidez se siente cuando el cido del estmago vuelve hacia el esfago. Durante el [place]embarazo, puede ser causada por distintas cosas, por ejemplo:   La hormona progesterona.  Cambios en los niveles hormonales.  El tero crece y [address]empuja el cido del estmago [place] arriba.  Comidas abundantes  Ciertos alimentos y [address]bebidas  Haga actividad fsica.  Aumento en la produccin de cido SIGNOS Y SNTOMAS   Sensacin de ardor en el pecho o en la parte inferior de la garganta.  Sabor amargo en la boca.  Tos. DIAGNSTICO  El mdico diagnostica la Anthoney Haradaacidez con una historia clnica cuidadosa en la que pregunta por sus molestias. Le indicar anlisis de sangre para Clinical research associateencontrar cierto tipo de bacteria que se asocia con la [place]acidez. En algunos casos se  diagnostica recetando un medicamento para calmar la acidez y viendo si los sntomas mejoran. En algunos casos, se realiza un procedimiento llamado endoscopa. En este procedimiento se [place]a un tubo con Neomia Dearuna luz y Posey Boyeruna cmara en un extremo (endoscopio) , y se examina el esfago y Investment banker, corporateel estmago. TRATAMIENTO  El tratamiento variar segn la gravedad de los sntomas. El mdico podr indicar:  Medicamentos de Sales promotion account executiveventa libre (anticidos, medicamentos para Conservator, museum/gallerydisminuir la Engineering geologistacidez) en los casos de acidez leve.  Medicamentos recetados para disminuir el cido estomacal o para proteger la superficie del Aldenestmago.  Ciertos cambios en la dieta.  Elevacin de la cabecera de la cama colocando bloques debajo de las patas. De esta manera evitar que el cido del estmago vuelva al esfago mientras est recostado. INSTRUCCIONES PARA EL CUIDADO EN EL HOGAR   Tome slo medicamentos de venta libre o recetados, segn las indicaciones del mdico.  Eleve la cabecera de la cama colocando bloques debajo de las patas, si el mdico lo aconsej. Usar ms almohadas al dormir no es Secretary/administratorefectivo porque solo modificara la posicin de la cabeza.  No  haga ejercicios enseguida despus de comer.  Evite comer 2 o 3horas antes de irse a dormir. No se acueste enseguida despus de comer.  Haga comidas pequeas Freight forwarderdurante el da en lugar de tres comidas abundantes.  Identifique los alimentos o las bebidas que empeoran sus sntomas y evtelos. Los alimentos que debe evitar son:  [place]ienta.  Chocolate.  Alimentos con alto contenido de grasas, incluyendo las comidas fritas  Comidas muy condimentadas.  Ajo y [address]  [address], como naranja, pomelo, limn y lima  Alimentos o productos que contengan tomate  Menta.  Bebidas gaseosas y con cafena.  [place]agre SOLICITE ATENCIN MDICA SI:  Tiene cualquier tipo de dolor abdominal.  Siente ardor en la parte superior del abdomen o el pecho, especialmente despus de comer o mientras est  acostada.  Tiene nuseas o vmitos.  Siente malestar estomacal despus de comer. SOLICITE ATENCIN MDICA DE INMEDIATO SI:   Siente un dolor intenso en el pecho que baja por el brazo o va hacia al mandbula o el cuello.  Se siente mareado o sufre un desmayo.  Comienza a sentir falta de aire.  Vomita sangre.  Tiene dificultad o dolor al tragar.  La materia fecal es negra, de aspecto alquitranado.  Tiene acidez ms de 3 veces por semana, durante ms de 2 semanas. ASEGRESE DE QUE:  Comprende estas instrucciones.  Controlar su afeccin.  Recibir ayuda de inmediato si no mejora o si empeora. Document Released: 12/13/2004 Document Revised: 12/24/2012 [facility] Patient Information 2015 [place], [place]. This information is not intended to replace advice given to you by your health care provider. Make sure you discuss any questions you have with your health care provider.

## 2013-12-26 ENCOUNTER — Encounter (HOSPITAL_COMMUNITY): Payer: Self-pay

## 2013-12-26 ENCOUNTER — Inpatient Hospital Stay (HOSPITAL_COMMUNITY)
Admission: RE | Admit: 2013-12-26 | Discharge: 2013-12-27 | DRG: 775 | Disposition: A | Discharge status: Home / Self Care | Payer: Medicaid Other | Source: Ambulatory Visit | Attending: Obstetrics | Admitting: Obstetrics

## 2013-12-26 DIAGNOSIS — Z3A40 40 weeks gestation of pregnancy: Secondary | ICD-10-CM | POA: Diagnosis present

## 2013-12-26 DIAGNOSIS — Z3490 Encounter for supervision of normal pregnancy, unspecified, unspecified trimester: Secondary | ICD-10-CM

## 2013-12-26 DIAGNOSIS — O9989 Other specified diseases and conditions complicating pregnancy, childbirth and the puerperium: Secondary | ICD-10-CM | POA: Diagnosis present

## 2013-12-26 LAB — CBC
HCT: 35.4 % — ABNORMAL LOW (ref 36.0–46.0)
Hemoglobin: 11.7 g/dL — ABNORMAL LOW (ref 12.0–15.0)
MCH: 27.9 pg (ref 26.0–34.0)
MCHC: 33.1 g/dL (ref 30.0–36.0)
MCV: 84.3 fL (ref 78.0–100.0)
PLATELETS: 161 10*3/uL (ref 150–400)
RBC: 4.2 MIL/uL (ref 3.87–5.11)
RDW: 15.7 % — ABNORMAL HIGH (ref 11.5–15.5)
WBC: 9.3 10*3/uL (ref 4.0–10.5)

## 2013-12-26 LAB — TYPE AND SCREEN
ABO/RH(D): O POS
ANTIBODY SCREEN: NEGATIVE

## 2013-12-26 LAB — ABO/RH: ABO/RH(D): O POS

## 2013-12-26 LAB — RPR

## 2013-12-26 MED ORDER — EPHEDRINE 5 MG/ML INJ
10.0000 mg | INTRAVENOUS | Status: DC | PRN
  Filled 2013-12-26: qty 2

## 2013-12-26 MED ORDER — PHENYLEPHRINE 40 MCG/ML (10ML) SYRINGE FOR IV PUSH (FOR BLOOD PRESSURE SUPPORT)
80.0000 ug | PREFILLED_SYRINGE | INTRAVENOUS | Status: DC | PRN
  Filled 2013-12-26: qty 2

## 2013-12-26 MED ORDER — ACETAMINOPHEN 325 MG PO TABS: 650.0000 mg | ORAL_TABLET | ORAL | Status: DC | PRN

## 2013-12-26 MED ORDER — OXYCODONE-ACETAMINOPHEN 5-325 MG PO TABS
1.0000 | ORAL_TABLET | ORAL | Status: DC | PRN
Start: 2013-12-26 — End: 2013-12-28
  Administered 2013-12-26: 1 via ORAL
  Filled 2013-12-26 (×3): qty 1

## 2013-12-26 MED ORDER — INFLUENZA VAC SPLIT QUAD 0.5 ML IM SUSY
0.5000 mL | PREFILLED_SYRINGE | INTRAMUSCULAR | Status: DC
  Filled 2013-12-26: qty 0.5

## 2013-12-26 MED ORDER — IBUPROFEN 600 MG PO TABS
600.0000 mg | ORAL_TABLET | Freq: Four times a day (QID) | ORAL | Status: DC
  Administered 2013-12-26 – 2013-12-27 (×4): 600 mg via ORAL
  Filled 2013-12-26 (×4): qty 1

## 2013-12-26 MED ORDER — DIBUCAINE 1 % RE OINT: 1.0000 "application " | TOPICAL_OINTMENT | RECTAL | Status: DC | PRN

## 2013-12-26 MED ORDER — TERBUTALINE SULFATE 1 MG/ML IJ SOLN: 0.2500 mg | Freq: Once | INTRAMUSCULAR | Status: DC | PRN

## 2013-12-26 MED ORDER — DIPHENHYDRAMINE HCL 25 MG PO CAPS: 25.0000 mg | ORAL_CAPSULE | Freq: Four times a day (QID) | ORAL | Status: DC | PRN

## 2013-12-26 MED ORDER — ONDANSETRON HCL 4 MG/2ML IJ SOLN: 4.0000 mg | Freq: Four times a day (QID) | INTRAMUSCULAR | Status: DC | PRN

## 2013-12-26 MED ORDER — CITRIC ACID-SODIUM CITRATE 334-500 MG/5ML PO SOLN: 30.0000 mL | ORAL | Status: DC | PRN

## 2013-12-26 MED ORDER — ONDANSETRON HCL 4 MG PO TABS: 4.0000 mg | ORAL_TABLET | ORAL | Status: DC | PRN

## 2013-12-26 MED ORDER — PROMETHAZINE HCL 25 MG/ML IJ SOLN
12.5000 mg | Freq: Four times a day (QID) | INTRAMUSCULAR | Status: DC | PRN
  Administered 2013-12-26: 12.5 mg via INTRAVENOUS
  Filled 2013-12-26: qty 1

## 2013-12-26 MED ORDER — FERROUS SULFATE 325 (65 FE) MG PO TABS
325.0000 mg | ORAL_TABLET | Freq: Two times a day (BID) | ORAL | Status: DC
  Administered 2013-12-27 (×2): 325 mg via ORAL
  Filled 2013-12-26 (×2): qty 1

## 2013-12-26 MED ORDER — DIPHENHYDRAMINE HCL 50 MG/ML IJ SOLN: 12.5000 mg | INTRAMUSCULAR | Status: DC | PRN

## 2013-12-26 MED ORDER — SENNOSIDES-DOCUSATE SODIUM 8.6-50 MG PO TABS
2.0000 | ORAL_TABLET | ORAL | Status: DC
  Administered 2013-12-26: 2 via ORAL
  Filled 2013-12-26: qty 2

## 2013-12-26 MED ORDER — OXYCODONE-ACETAMINOPHEN 5-325 MG PO TABS: 2.0000 | ORAL_TABLET | ORAL | Status: DC | PRN

## 2013-12-26 MED ORDER — LACTATED RINGERS IV SOLN: 500.0000 mL | Freq: Once | INTRAVENOUS | Status: DC

## 2013-12-26 MED ORDER — FLEET ENEMA 7-19 GM/118ML RE ENEM: 1.0000 | ENEMA | RECTAL | Status: DC | PRN

## 2013-12-26 MED ORDER — LACTATED RINGERS IV SOLN: 500.0000 mL | INTRAVENOUS | Status: DC | PRN

## 2013-12-26 MED ORDER — OXYTOCIN 40 UNITS IN LACTATED RINGERS INFUSION - SIMPLE MED
62.5000 mL/h | INTRAVENOUS | Status: DC
  Administered 2013-12-26: 62.5 mL/h via INTRAVENOUS

## 2013-12-26 MED ORDER — NALOXONE HCL 0.4 MG/ML IJ SOLN
INTRAMUSCULAR | Status: AC
  Filled 2013-12-26: qty 1

## 2013-12-26 MED ORDER — WITCH HAZEL-GLYCERIN EX PADS: 1.0000 "application " | MEDICATED_PAD | CUTANEOUS | Status: DC | PRN

## 2013-12-26 MED ORDER — FENTANYL 2.5 MCG/ML BUPIVACAINE 1/10 % EPIDURAL INFUSION (WH - ANES): 14.0000 mL/h | INTRAMUSCULAR | Status: DC | PRN

## 2013-12-26 MED ORDER — TETANUS-DIPHTH-ACELL PERTUSSIS 5-2.5-18.5 LF-MCG/0.5 IM SUSP
0.5000 mL | Freq: Once | INTRAMUSCULAR | Status: AC
  Administered 2013-12-27: 0.5 mL via INTRAMUSCULAR
  Filled 2013-12-26: qty 0.5

## 2013-12-26 MED ORDER — LACTATED RINGERS IV SOLN
INTRAVENOUS | Status: DC
  Administered 2013-12-26 (×2): via INTRAVENOUS

## 2013-12-26 MED ORDER — OXYTOCIN BOLUS FROM INFUSION: 500.0000 mL | INTRAVENOUS | Status: DC

## 2013-12-26 MED ORDER — BENZOCAINE-MENTHOL 20-0.5 % EX AERO: 1.0000 "application " | INHALATION_SPRAY | CUTANEOUS | Status: DC | PRN

## 2013-12-26 MED ORDER — OXYTOCIN 40 UNITS IN LACTATED RINGERS INFUSION - SIMPLE MED
1.0000 m[IU]/min | INTRAVENOUS | Status: DC
  Administered 2013-12-26: 2 m[IU]/min via INTRAVENOUS
  Filled 2013-12-26: qty 1000

## 2013-12-26 MED ORDER — OXYCODONE-ACETAMINOPHEN 5-325 MG PO TABS
1.0000 | ORAL_TABLET | ORAL | Status: DC | PRN
  Administered 2013-12-27 (×3): 1 via ORAL
  Filled 2013-12-26: qty 1

## 2013-12-26 MED ORDER — LANOLIN HYDROUS EX OINT: TOPICAL_OINTMENT | CUTANEOUS | Status: DC | PRN

## 2013-12-26 MED ORDER — ZOLPIDEM TARTRATE 5 MG PO TABS: 5.0000 mg | ORAL_TABLET | Freq: Every evening | ORAL | Status: DC | PRN

## 2013-12-26 MED ORDER — LIDOCAINE HCL (PF) 1 % IJ SOLN
30.0000 mL | INTRAMUSCULAR | Status: DC | PRN
  Filled 2013-12-26: qty 30

## 2013-12-26 MED ORDER — ONDANSETRON HCL 4 MG/2ML IJ SOLN: 4.0000 mg | INTRAMUSCULAR | Status: DC | PRN

## 2013-12-26 MED ORDER — BUTORPHANOL TARTRATE 1 MG/ML IJ SOLN
1.0000 mg | INTRAMUSCULAR | Status: DC | PRN
  Administered 2013-12-26 (×5): 1 mg via INTRAVENOUS
  Filled 2013-12-26 (×5): qty 1

## 2013-12-26 MED ORDER — SIMETHICONE 80 MG PO CHEW: 80.0000 mg | CHEWABLE_TABLET | ORAL | Status: DC | PRN

## 2013-12-26 MED ORDER — PRENATAL MULTIVITAMIN CH
1.0000 | ORAL_TABLET | Freq: Every day | ORAL | Status: DC
  Administered 2013-12-27: 1 via ORAL
  Filled 2013-12-26: qty 1

## 2013-12-26 NOTE — H&P (Signed)
This is Dr. Francoise CeoBernard Marshall dictating the history and physical on  [name]  she is a 28 year old gravida 3 para 2 all to at 40 weeks and 3 days negative GBS was brought in for induction cervix is 5 cm 80% vertex -3 and she is on low-dose Pitocin amniotomy was performed the fluids clear Past medical history negative Past surgical history negative Social history negative System review noncontributory Physical exam well-developed female in labor HEENT negative Lungs clear to P&A Heart regular rhythm no murmurs no gallops Breasts negative Abdomen term Pelvic as described above Extremities negative

## 2013-12-27 LAB — CBC
HEMATOCRIT: 33.3 % — AB (ref 36.0–46.0)
HEMOGLOBIN: 11 g/dL — AB (ref 12.0–15.0)
MCH: 27.8 pg (ref 26.0–34.0)
MCHC: 33 g/dL (ref 30.0–36.0)
MCV: 84.3 fL (ref 78.0–100.0)
Platelets: 160 10*3/uL (ref 150–400)
RBC: 3.95 MIL/uL (ref 3.87–5.11)
RDW: 15.7 % — ABNORMAL HIGH (ref 11.5–15.5)
WBC: 14.2 10*3/uL — ABNORMAL HIGH (ref 4.0–10.5)

## 2013-12-27 MED ORDER — INFLUENZA VAC SPLIT QUAD 0.5 ML IM SUSY
0.5000 mL | PREFILLED_SYRINGE | INTRAMUSCULAR | Status: DC
  Administered 2013-12-27: 0.5 mL via INTRAMUSCULAR

## 2013-12-27 NOTE — Progress Notes (Signed)
Patient ID: Toni LandsmanWendis Villalta, female   DOB: March 29, 1985, 29 y.o.   MRN: [id number] Postpartum day one Vital signs normal Fundus firm Lochia moderate Patient wants her to discharge home today

## 2013-12-27 NOTE — Progress Notes (Signed)
Assisted RN with interpretation Patient refused interpreter  Joselyn GlassmanBenita Sanchez - Spanish Interpreter

## 2013-12-27 NOTE — Plan of Care (Signed)
Problem: Phase II Progression Outcomes Goal: Progress activity as tolerated unless otherwise ordered Outcome: Completed/Met Date Met:  12/27/13 Pt ambulating frequently and independently; providing appropriate care to newborn Goal: Rh isoimmunization per orders Outcome: Not Applicable Date Met:  82/42/99 Mom is O+

## 2013-12-27 NOTE — Discharge Summary (Signed)
Obstetric Discharge Summary Reason for Admission: induction of labor Prenatal Procedures: none Intrapartum Procedures: spontaneous vaginal delivery Postpartum Procedures: none Complications-Operative and Postpartum: none Hemoglobin  Date Value Ref Range Status  12/26/2013 11.7* 12.0 - 15.0 g/dL Final     HCT  Date Value Ref Range Status  12/26/2013 35.4* 36.0 - 46.0 % Final    Physical Exam:  General: alert Lochia: appropriate Uterine Fundus: firm Incision: healing well DVT Evaluation: No evidence of DVT seen on physical exam.  Discharge Diagnoses: Term Pregnancy-delivered  Discharge Information: Date: 12/27/2013 Activity: pelvic rest Diet: routine Medications: Percocet Condition: improved Instructions: refer to practice specific booklet Discharge to: home Follow-up Information   Follow up with Kathreen CosierMARSHALL,BERNARD A, MD.   Specialty:  Obstetrics and Gynecology   Contact information:   [address] [place] [phone]       Newborn Data: Live born female  Birth Weight: 8 lb 1.5 oz (3670 g) APGAR: 8, 9  Home with mother.  MARSHALL,BERNARD A 12/27/2013, 6:17 AM

## 2013-12-27 NOTE — Discharge Instructions (Signed)
Discharge instructions ° °· You can wash your hair °· Shower °· Eat what you want °· Drink what you want °· See me in 6 weeks °· Your ankles are going to swell more in the next 2 weeks than when pregnant °· No sex for 6 weeks ° ° °MARSHALL,BERNARD A, MD 12/27/2013 ° ° °

## 2014-01-04 NOTE — Progress Notes (Signed)
Post discharge chart review completed.  

## 2014-01-18 ENCOUNTER — Encounter (HOSPITAL_COMMUNITY): Payer: Self-pay

## 2015-09-07 ENCOUNTER — Other Ambulatory Visit: Payer: Self-pay

## 2015-09-07 ENCOUNTER — Emergency Department (HOSPITAL_COMMUNITY)
Admission: EM | Admit: 2015-09-07 | Discharge: 2015-09-07 | Disposition: A | Discharge status: Home / Self Care | Payer: Self-pay | Attending: Emergency Medicine | Admitting: Emergency Medicine

## 2015-09-07 ENCOUNTER — Encounter (HOSPITAL_COMMUNITY): Payer: Self-pay | Admitting: Emergency Medicine

## 2015-09-07 ENCOUNTER — Emergency Department (HOSPITAL_COMMUNITY): Payer: Self-pay

## 2015-09-07 DIAGNOSIS — R0789 Other chest pain: Secondary | ICD-10-CM | POA: Insufficient documentation

## 2015-09-07 LAB — CBC
HCT: 42.4 % (ref 36.0–46.0)
HEMOGLOBIN: 14.1 g/dL (ref 12.0–15.0)
MCH: 28.5 pg (ref 26.0–34.0)
MCHC: 33.3 g/dL (ref 30.0–36.0)
MCV: 85.7 fL (ref 78.0–100.0)
PLATELETS: 259 10*3/uL (ref 150–400)
RBC: 4.95 MIL/uL (ref 3.87–5.11)
RDW: 13.6 % (ref 11.5–15.5)
WBC: 10.2 10*3/uL (ref 4.0–10.5)

## 2015-09-07 LAB — BASIC METABOLIC PANEL
ANION GAP: 8 (ref 5–15)
BUN: 9 mg/dL (ref 6–20)
CHLORIDE: 107 mmol/L (ref 101–111)
CO2: 24 mmol/L (ref 22–32)
Calcium: 9.4 mg/dL (ref 8.9–10.3)
Creatinine, Ser: 0.82 mg/dL (ref 0.44–1.00)
GFR calc non Af Amer: >60 mL/min (ref >60)
Glucose, Bld: 99 mg/dL (ref 65–99)
Potassium: 3.6 mmol/L (ref 3.5–5.1)
SODIUM: 139 mmol/L (ref 135–145)

## 2015-09-07 LAB — I-STAT TROPONIN, ED: TROPONIN I, POC: 0 ng/mL (ref 0.00–0.08)

## 2015-09-07 NOTE — ED Provider Notes (Signed)
CSN: [id number]     Arrival date & time 09/07/15  1133 History   First MD Initiated Contact with Patient 09/07/15 1138     Chief Complaint  Patient presents with  . Chest Pain     (Consider location/radiation/quality/duration/timing/severity/associated sxs/prior Treatment) HPI [name] is a 30 y.o. female who presents for evaluation of burning chest pain over the past one week. Patient reports she feels "a fire sensation in my chest" that started on her right chest and has now migrated to her left chest. Symptoms have been constant over the past 1.5 days. Discomfort is exacerbated with deep respiration. Denies any fevers, chills, shortness of breath, abdominal pain, nausea or vomiting, diaphoresis, cough/hemoptysis, numbness or weakness, leg pain or unilateral leg swelling. Nothing makes symptoms better or worse. No other modifying factors. Nonsmoker, no family history of heart disease.  Past Medical History  Diagnosis Date  . Medical history non-contributory    Past Surgical History  Procedure Laterality Date  . No past surgeries     Family History  Problem Relation Age of Onset  . Alcohol abuse Neg Hx    Social History  Substance Use Topics  . Smoking status: Never Smoker   . Smokeless tobacco: None  . Alcohol Use: No   OB History    Gravida Para Term Preterm AB TAB SAB Ectopic Multiple Living   0 0 0 0 0 0 3     Review of Systems A 10 point review of systems was completed and was negative except for pertinent positives and negatives as mentioned in the history of present illness     Allergies  Review of patient's allergies indicates no known allergies.  Home Medications   Prior to Admission medications   Medication Sig Start Date End Date Taking? Authorizing Provider  ibuprofen (ADVIL,MOTRIN) 200 MG tablet Take 400 mg by mouth every 6 (six) hours as needed.   Yes Historical Provider, MD   BP 118/78 mmHg  Pulse 77  Temp(Src) 98.4 F (36.9 C) (Oral)   Resp 13  Ht  (1.626 m)  Wt 60.782 kg  BMI 22.99 kg/m2  SpO2 100% Physical Exam  Constitutional: She is oriented to person, place, and time. She appears well-developed and well-nourished.  HENT:  Head: Normocephalic and atraumatic.  Mouth/Throat: Oropharynx is clear and moist.  Eyes: Conjunctivae are normal. Pupils are equal, round, and reactive to light. Right eye exhibits no discharge. Left eye exhibits no discharge. No scleral icterus.  Neck: Neck supple.  Cardiovascular: Normal rate, regular rhythm and normal heart sounds.   Pulmonary/Chest: Effort normal and breath sounds normal. No respiratory distress. She has no wheezes. She has no rales.    Discomfort is replicated and exacerbated with palpation of pectoralis major musculature and intercostal spaces. No rash, erythema, crepitus or other abnormal findings.  Abdominal: Soft. There is no tenderness.  Musculoskeletal: She exhibits no tenderness.  Neurological: She is alert and oriented to person, place, and time.  Cranial Nerves II-XII grossly intact  Skin: Skin is warm and dry. No rash noted.  Psychiatric: She has a normal mood and affect.  Nursing note and vitals reviewed.   ED Course  Procedures (including critical care time) Labs Review Labs Reviewed  BASIC METABOLIC PANEL  CBC  I-STAT TROPOININ, ED    Imaging Review Dg Chest 2 View  09/07/2015  CLINICAL DATA:  Chest burning for 1 week, worse today. Shortness of breath. EXAM: CHEST  2 VIEW COMPARISON:  06/05/2012  FINDINGS: The heart size and mediastinal contours are normal. The lungs are clear. There is no pleural effusion or pneumothorax. No acute osseous findings are identified. Prominent nipple shadows are again noted. IMPRESSION: Stable chest.  No active cardiopulmonary process. Electronically Signed   By: Carey BullocksWilliam  Veazey M.D.   On: 09/07/2015 12:09   I have personally reviewed and evaluated these images and lab results as part of my medical  decision-making.   EKG Interpretation   Date/Time:  Wednesday September 07 2015 11:37:14 EDT Ventricular Rate:  79 PR Interval:  136 QRS Duration: 82 QT Interval:  370 QTC Calculation: 424 R Axis:   73 Text Interpretation:  Normal sinus rhythm Normal ECG Normal ECG Confirmed  by Gerhard MunchLOCKWOOD, ROBERT  MD ([id number]) on 09/07/2015 11:49:58 AM      MDM  Patient with atypical chest pain with pleuritic component, constant over the past 1.5 days. Discomfort replicated with palpation of chest wall-likely musculoskeletal in etiology. Not consistent with ACS. Low well score, doubt PE. Benign cardiopulmonary exam with negative chest x-ray. Heart score 0. Troponin negative. No indication to repeat as pain has been present/constant greater than 36 hours. EKG normal. Discussed follow-up with PCP for reevaluation. Return precautions discussed. She verbalized understanding, agrees with this plan and subsequent discharge. Final diagnoses:  Chest wall pain        Joycie PeekBenjamin Denece Shearer, PA-C 09/07/15 1325  Gerhard Munchobert Lockwood, MD 09/07/15 [phone]

## 2015-09-07 NOTE — ED Notes (Signed)
Patient transported to X-ray 

## 2015-09-07 NOTE — Discharge Instructions (Signed)
Your exam, labs, x-ray and EKG are all reassuring. Continue taking Tylenol and Motrin for your discomfort. Follow-up with your doctor for reevaluation. Return to ED for new or worsening symptoms as we discussed.  Dolor en la pared torcica (Chest Wall Pain) El dolor en la pared torcica se produce en los huesos y los msculos del pecho o alrededor de Scientist, product/process developmentestos. A veces, una lesin Occupational psychologistcausa este dolor. En ocasiones, la causa puede ser desconocida. Este dolor puede durar varias semanas. INSTRUCCIONES PARA EL CUIDADO EN EL HOGAR  Est atento a cualquier cambio en los sntomas. Tome estas medidas para Acupuncturistaliviar el dolor:   Haga reposo como se lo haya indicado el mdico.   Evite las actividades que causan dolor. Estas pueden ser Charles Schwabaquellas que requieren el uso de los msculos del trax, los abdominales o los laterales para levantar objetos pesados.   Si se lo indican, aplique hielo sobre la zona dolorida:  Ponga el hielo en una bolsa plstica.  Coloque una toalla entre la piel y la bolsa de hielo.  Coloque el hielo durante 20minutos, 2 a 3veces por Futures traderda.  Tome los medicamentos de venta libre y los recetados solamente como se lo haya indicado el mdico.  No consuma productos que contengan tabaco, incluidos cigarrillos, tabaco de Theatre managermascar y Administrator, Civil Servicecigarrillos electrnicos. Si necesita ayuda para dejar de fumar, consulte al mdico.  Concurra a todas las visitas de control como se lo haya indicado el mdico. Esto es importante. SOLICITE ATENCIN MDICA SI:  Lance Mussiene fiebre.  El dolor de [place] empeora.  Aparecen nuevos sntomas. SOLICITE ATENCIN MDICA DE INMEDIATO SI:  Tiene nuseas o vmitos.  Berenice Primasranspira o tiene sensacin de desvanecimiento.  Tiene tos con flema (esputo) o expectora sangre al toser.  Le falta el aire.   Esta informacin no tiene Theme park managercomo fin reemplazar el consejo del mdico. Asegrese de hacerle al mdico cualquier pregunta que tenga.   Document Released: 04/16/2006 Document Revised:  11/24/2014 Elsevier Interactive Patient Education [employer]2016 Elsevier Inc.

## 2015-09-07 NOTE — ED Notes (Signed)
Pt reports having a feeling of "fire in chest" for 1 week that has been coming and going but more constant today and feeling short of breath. Pt is warm and dry.

## 2016-04-18 ENCOUNTER — Encounter (HOSPITAL_COMMUNITY): Payer: Self-pay

## 2016-04-18 ENCOUNTER — Emergency Department (HOSPITAL_COMMUNITY): Payer: Self-pay

## 2016-04-18 ENCOUNTER — Emergency Department (HOSPITAL_COMMUNITY)
Admission: EM | Admit: 2016-04-18 | Discharge: 2016-04-18 | Disposition: A | Discharge status: Home / Self Care | Payer: Self-pay | Attending: Emergency Medicine | Admitting: Emergency Medicine

## 2016-04-18 DIAGNOSIS — M5441 Lumbago with sciatica, right side: Secondary | ICD-10-CM | POA: Insufficient documentation

## 2016-04-18 DIAGNOSIS — Y929 Unspecified place or not applicable: Secondary | ICD-10-CM | POA: Insufficient documentation

## 2016-04-18 DIAGNOSIS — Y999 Unspecified external cause status: Secondary | ICD-10-CM | POA: Insufficient documentation

## 2016-04-18 DIAGNOSIS — W010XXA Fall on same level from slipping, tripping and stumbling without subsequent striking against object, initial encounter: Secondary | ICD-10-CM | POA: Insufficient documentation

## 2016-04-18 DIAGNOSIS — Y939 Activity, unspecified: Secondary | ICD-10-CM | POA: Insufficient documentation

## 2016-04-18 MED ORDER — IBUPROFEN 600 MG PO TABS
600.0000 mg | ORAL_TABLET | Freq: Four times a day (QID) | ORAL | 0 refills | Status: DC | PRN
Start: 2016-04-18 — End: 2017-02-13

## 2016-04-18 MED ORDER — METHOCARBAMOL 500 MG PO TABS
1000.0000 mg | ORAL_TABLET | Freq: Once | ORAL | Status: AC
  Administered 2016-04-18: 1000 mg via ORAL
  Filled 2016-04-18: qty 2

## 2016-04-18 MED ORDER — DEXAMETHASONE SODIUM PHOSPHATE 10 MG/ML IJ SOLN
10.0000 mg | Freq: Once | INTRAMUSCULAR | Status: AC
  Administered 2016-04-18: 10 mg via INTRAMUSCULAR
  Filled 2016-04-18: qty 1

## 2016-04-18 MED ORDER — IBUPROFEN 200 MG PO TABS
600.0000 mg | ORAL_TABLET | Freq: Once | ORAL | Status: AC
  Administered 2016-04-18: 600 mg via ORAL
  Filled 2016-04-18: qty 1

## 2016-04-18 MED ORDER — METHOCARBAMOL 500 MG PO TABS: 500.0000 mg | ORAL_TABLET | Freq: Every evening | ORAL | 0 refills | Status: DC | PRN

## 2016-04-18 NOTE — ED Provider Notes (Signed)
[facility]-EMERGENCY DEPT Provider Note   CSN: [id number] Arrival date & time: 04/18/16  1348     History   Chief Complaint Chief Complaint  Patient presents with  . Back Pain    HPI [name] is a 31 y.o. female who presents with back pain. She states she has intermittent back pain due to a fall 2 years ago. Two days ago she slipped and fell on to her buttocks. She was unable to walk due to pain afterwards but has been able to walk since then short distances. She also reports numbness and pain shooting down from the upper right leg down to the toes. No fever, syncope, loss of bowel/bladder function, saddle anesthesia, urinary retention. She has been taking Ibuprofen without relief.    HPI  Past Medical History:  Diagnosis Date  . Medical history non-contributory     Patient Active Problem List   Diagnosis Date Noted  . Pregnancy 12/26/2013  . NVD (normal vaginal delivery) 12/26/2013  . Fall 11/27/2013    Past Surgical History:  Procedure Laterality Date  . NO PAST SURGERIES      OB History    Gravida Para Term Preterm AB Living   3 3 3  0 0 3   SAB TAB Ectopic Multiple Live Births   0 0 0 0 1       Home Medications    Prior to Admission medications   Medication Sig Start Date End Date Taking? Authorizing Provider  cetirizine (ZYRTEC) 10 MG tablet Take 10 mg by mouth at bedtime as needed for allergies. 04/03/16  Yes Historical Provider, MD  ibuprofen (ADVIL,MOTRIN) 200 MG tablet Take 400 mg by mouth every 6 (six) hours as needed for mild pain.    Yes Historical Provider, MD    Family History Family History  Problem Relation Age of Onset  . Alcohol abuse Neg Hx     Social History Social History  Substance Use Topics  . Smoking status: Never Smoker  . Smokeless tobacco: Never Used  . Alcohol use No     Allergies   Patient has no known allergies.   Review of Systems Review of Systems  Constitutional: Negative for fever.  Genitourinary: Negative  for difficulty urinating, frequency and urgency.  Musculoskeletal: Positive for back pain, gait problem and myalgias.  Skin: Negative for wound.  Neurological: Positive for numbness. Negative for weakness.  All other systems reviewed and are negative.    Physical Exam Updated Vital Signs BP 125/84 (BP Location: Right Arm)   Pulse 79   Temp 98.9 F (37.2 C) (Oral)   Resp 16   Ht 5' (1.524 m)   Wt 63 kg   SpO2 100%   BMI 27.15 kg/m   Physical Exam  Constitutional: She is oriented to person, place, and time. She appears well-developed and well-nourished. No distress.  Appears uncomfortable  HENT:  Head: Normocephalic and atraumatic.  Eyes: Conjunctivae are normal. Pupils are equal, round, and reactive to light. Right eye exhibits no discharge. Left eye exhibits no discharge. No scleral icterus.  Neck: Normal range of motion.  Cardiovascular: Normal rate.   Pulmonary/Chest: Effort normal. No respiratory distress.  Abdominal: She exhibits no distension.  Genitourinary:  Genitourinary Comments: Rectal: Normal rectal tone. Normal sensation. Chaperone (Sherita, NT) present during exam.    Musculoskeletal:  Inspection: No masses, deformity, or rash Palpation: Midline spinal tenderness from upper lumbar spine to sacrum with most tenderness over sacrum. Diffuse paraspinal muscle tenderness Strength: 5/5 in lower extremities  and normal plantar and dorsiflexion Sensation: Subjective numbness of right lower extremity from thigh to toe.  Reflexes: Patellar reflex is 2+ bilaterally SLR: Positive seated straight leg raise on right side Gait: Ambulatory without difficulty   Neurological: She is alert and oriented to person, place, and time.  Skin: Skin is warm and dry.  Psychiatric: She has a normal mood and affect. Her behavior is normal.  Nursing note and vitals reviewed.    ED Treatments / Results  Labs (all labs ordered are listed, but only abnormal results are displayed) Labs  Reviewed - No data to display  EKG  EKG Interpretation None       Radiology Dg Lumbar Spine 2-3 Views  Addendum Date: 04/18/2016   ADDENDUM REPORT: 04/18/2016 15:04 ADDENDUM: AP and lateral view of the sacrum/coccyx also obtained obtained. No acute or focal bony abnormality identified . Electronically Signed   By: Maisie Fus  Register   On: 04/18/2016 15:04   Result Date: 04/18/2016 CLINICAL DATA:  Recent fall with low back pain, initial encounter EXAM: LUMBAR SPINE - 2-3 VIEW COMPARISON:  None. FINDINGS: Bilateral L5 pars defects are noted with mild grade 1 anterolisthesis of L5 on S1. These are felt to be congenital in nature given the appearance. Vertebral body height is well maintained. No other focal abnormality is seen. IMPRESSION: Bilateral L5 pars defects with anterolisthesis of L5 on S1. Electronically Signed: By: Alcide Clever M.D. On: 04/18/2016 14:55    Procedures Procedures (including critical care time)  Medications Ordered in ED Medications  methocarbamol (ROBAXIN) tablet 1,000 mg (1,000 mg Oral Given 04/18/16 1627)  dexamethasone (DECADRON) injection 10 mg (10 mg Intramuscular Given 04/18/16 1628)  ibuprofen (ADVIL,MOTRIN) tablet 600 mg (600 mg Oral Given 04/18/16 1628)     Initial Impression / Assessment and Plan / ED Course  I have reviewed the triage vital signs and the nursing notes.  Pertinent labs & imaging results that were available during my care of the patient were reviewed by me and considered in my medical decision making (see chart for details).  31 year old female with acute on chronic back pain after a fall several days ago. Vitals are overall normal. Exam concerning for herniated disc but low concern for cauda equina. No bowel/bladder infection, she is ambulatory, no leg weakness, normal rectal tone. Xrays negative for fracture. Pain treated in ED.  On recheck, pt states pain is improved. Will d/c with NSAIDs and muscle relaxer and advised neurosurgery f/u.  Patient is NAD, non-toxic, with stable VS. Patient is informed of clinical course, understands medical decision making process, and agrees with plan. Opportunity for questions provided and all questions answered. Return precautions given.   Final Clinical Impressions(s) / ED Diagnoses   Final diagnoses:  Acute right-sided low back pain with right-sided sciatica    New Prescriptions Discharge Medication List as of 04/18/2016  6:10 PM    START taking these medications   Details  !! ibuprofen (ADVIL,MOTRIN) 600 MG tablet Take 1 tablet (600 mg total) by mouth every 6 (six) hours as needed., Starting Wed 04/18/2016, Print    methocarbamol (ROBAXIN) 500 MG tablet Take 1 tablet (500 mg total) by mouth at bedtime and may repeat dose one time if needed., Starting Wed 04/18/2016, Print     !! - Potential duplicate medications found. Please discuss with provider.       Bethel Born, PA-C 04/19/16 1138    Laurence Spates, MD 04/21/16 [phone]

## 2016-04-18 NOTE — ED Triage Notes (Signed)
Pt presents with low back pain after falling on Monday.  Pt reports slipping on wet floor, landing on buttocks.  Pt reports feeling numbness to both legs and both palms.  Pt unable to walk but for short distance.  Pt denies any bowel or bladder incontinence.  Taking ibuprofen w/o relief.

## 2016-04-18 NOTE — ED Notes (Signed)
Jillene Buckshaperoned Kelly, PA with rectal examination

## 2016-04-24 ENCOUNTER — Other Ambulatory Visit (HOSPITAL_COMMUNITY): Payer: Self-pay | Admitting: Neurosurgery

## 2016-04-24 DIAGNOSIS — M4317 Spondylolisthesis, lumbosacral region: Secondary | ICD-10-CM

## 2016-05-07 ENCOUNTER — Ambulatory Visit (HOSPITAL_COMMUNITY)
Admission: RE | Admit: 2016-05-07 | Discharge: 2016-05-07 | Disposition: A | Discharge status: Home / Self Care | Payer: Medicaid Other | Source: Ambulatory Visit | Attending: Neurosurgery | Admitting: Neurosurgery

## 2016-05-07 DIAGNOSIS — M5126 Other intervertebral disc displacement, lumbar region: Secondary | ICD-10-CM | POA: Insufficient documentation

## 2016-05-07 DIAGNOSIS — M4317 Spondylolisthesis, lumbosacral region: Secondary | ICD-10-CM

## 2017-02-13 ENCOUNTER — Encounter: Payer: Self-pay | Admitting: Obstetrics & Gynecology

## 2017-02-13 ENCOUNTER — Ambulatory Visit: Payer: Self-pay | Admitting: Obstetrics & Gynecology

## 2017-02-13 VITALS — BP 132/97 | HR 95 | Wt 135.7 lb

## 2017-02-13 DIAGNOSIS — N809 Endometriosis, unspecified: Secondary | ICD-10-CM | POA: Insufficient documentation

## 2017-02-13 MED ORDER — NORETHIN-ETH ESTRAD-FE BIPHAS 1 MG-10 MCG / 10 MCG PO TABS: ORAL_TABLET | ORAL | 5 refills | Status: DC

## 2017-02-13 NOTE — Progress Notes (Signed)
Patient ID: [name], female   DOB: Nov 05, 1985, 31 y.o.   MRN: [id number]  Chief Complaint  Patient presents with  . Endometriosis    HPI [name] is a 31 y.o. female.  P3 with laparoscopy proven Stage 1 endometriosis (Dr. Macon LargeAnyanwu in 2012) here for pain management. She tried depo provera for years but her pain was not better. She tried OCP briefly but got darkening of her face skin. She had a Nexplanon placed 2 weeks ago. She describes her pain as severe. The provider at the health dept offered to order an ultrasound, but she declined.  HPI  Past Medical History:  Diagnosis Date  . Medical history non-contributory     Past Surgical History:  Procedure Laterality Date  . NO PAST SURGERIES      Family History  Problem Relation Age of Onset  . Alcohol abuse Neg Hx     Social History Social History   Tobacco Use  . Smoking status: Never Smoker  . Smokeless tobacco: Never Used  Substance Use Topics  . Alcohol use: No  . Drug use: No    No Known Allergies  Current Outpatient Medications  Medication Sig Dispense Refill  . ibuprofen (ADVIL,MOTRIN) 200 MG tablet Take 400 mg by mouth every 6 (six) hours as needed for mild pain.      No current facility-administered medications for this visit.     Review of Systems Review of Systems Pap smear done at the health dept Blood pressure (!) 132/97, pulse 95, weight 135 lb 11.2 oz (61.6 kg), unknown if currently breastfeeding.  Physical Exam Physical Exam Well nourished, well hydrated Hispanic female, no apparent distress Breathing, conversing, and ambulating normally Abd- benign  Data Reviewed   Assessment    Pain from endometriosis- I am hopeful that in the next few weeks the Nexplanon may help her pain. If it does not, then she can try a very low dose OCP. She will need her BP checked after starting it. She will take it in a continuous fashion. Plan    As above       [name] 02/13/2017, 2:52  PM

## 2017-02-13 NOTE — Addendum Note (Signed)
Addended by: Faythe CasaBELLAMY, Jorian Willhoite M on: 02/13/2017 03:55 PM   Modules accepted: Orders

## 2017-02-13 NOTE — Progress Notes (Signed)
Video Interpreter # [id number]

## 2017-11-22 ENCOUNTER — Encounter: Payer: Self-pay | Admitting: *Deleted

## 2017-12-07 ENCOUNTER — Emergency Department (HOSPITAL_COMMUNITY): Payer: Self-pay

## 2017-12-07 ENCOUNTER — Other Ambulatory Visit: Payer: Self-pay

## 2017-12-07 ENCOUNTER — Emergency Department (HOSPITAL_COMMUNITY)
Admission: EM | Admit: 2017-12-07 | Discharge: 2017-12-07 | Disposition: A | Discharge status: Home / Self Care | Payer: Self-pay | Attending: Emergency Medicine | Admitting: Emergency Medicine

## 2017-12-07 ENCOUNTER — Encounter (HOSPITAL_COMMUNITY): Payer: Self-pay | Admitting: Student

## 2017-12-07 DIAGNOSIS — K279 Peptic ulcer, site unspecified, unspecified as acute or chronic, without hemorrhage or perforation: Secondary | ICD-10-CM | POA: Insufficient documentation

## 2017-12-07 DIAGNOSIS — R197 Diarrhea, unspecified: Secondary | ICD-10-CM | POA: Insufficient documentation

## 2017-12-07 DIAGNOSIS — R1013 Epigastric pain: Secondary | ICD-10-CM | POA: Insufficient documentation

## 2017-12-07 DIAGNOSIS — Z79899 Other long term (current) drug therapy: Secondary | ICD-10-CM | POA: Insufficient documentation

## 2017-12-07 LAB — URINALYSIS, ROUTINE W REFLEX MICROSCOPIC
BILIRUBIN URINE: NEGATIVE
Glucose, UA: NEGATIVE mg/dL
HGB URINE DIPSTICK: NEGATIVE
Ketones, ur: NEGATIVE mg/dL
NITRITE: NEGATIVE
PH: 7 (ref 5.0–8.0)
Protein, ur: NEGATIVE mg/dL
SPECIFIC GRAVITY, URINE: 1.017 (ref 1.005–1.030)

## 2017-12-07 LAB — CBC
HCT: 43.1 % (ref 36.0–46.0)
Hemoglobin: 13.9 g/dL (ref 12.0–15.0)
MCH: 29.1 pg (ref 26.0–34.0)
MCHC: 32.3 g/dL (ref 30.0–36.0)
MCV: 90.2 fL (ref 78.0–100.0)
PLATELETS: 334 10*3/uL (ref 150–400)
RBC: 4.78 MIL/uL (ref 3.87–5.11)
RDW: 13.6 % (ref 11.5–15.5)
WBC: 9.1 10*3/uL (ref 4.0–10.5)

## 2017-12-07 LAB — COMPREHENSIVE METABOLIC PANEL
ALT: 54 U/L — ABNORMAL HIGH (ref 0–44)
ANION GAP: 12 (ref 5–15)
AST: 31 U/L (ref 15–41)
Albumin: 3.9 g/dL (ref 3.5–5.0)
Alkaline Phosphatase: 43 U/L (ref 38–126)
BILIRUBIN TOTAL: 0.7 mg/dL (ref 0.3–1.2)
BUN: 7 mg/dL (ref 6–20)
CO2: 24 mmol/L (ref 22–32)
Calcium: 9.2 mg/dL (ref 8.9–10.3)
Chloride: 102 mmol/L (ref 98–111)
Creatinine, Ser: 0.84 mg/dL (ref 0.44–1.00)
Glucose, Bld: 106 mg/dL — ABNORMAL HIGH (ref 70–99)
POTASSIUM: 4 mmol/L (ref 3.5–5.1)
Sodium: 138 mmol/L (ref 135–145)
TOTAL PROTEIN: 7.3 g/dL (ref 6.5–8.1)

## 2017-12-07 LAB — LIPASE, BLOOD: LIPASE: 27 U/L (ref 11–51)

## 2017-12-07 LAB — PREGNANCY, URINE: Preg Test, Ur: NEGATIVE

## 2017-12-07 MED ORDER — OMEPRAZOLE 20 MG PO CPDR: 20.0000 mg | DELAYED_RELEASE_CAPSULE | Freq: Every day | ORAL | 0 refills | Status: DC

## 2017-12-07 MED ORDER — IOHEXOL 300 MG/ML  SOLN
100.0000 mL | Freq: Once | INTRAMUSCULAR | Status: AC | PRN
  Administered 2017-12-07: 100 mL via INTRAVENOUS

## 2017-12-07 MED ORDER — GI COCKTAIL ~~LOC~~
30.0000 mL | Freq: Once | ORAL | Status: AC
Start: 2017-12-07 — End: 2017-12-07
  Administered 2017-12-07: 30 mL via ORAL
  Filled 2017-12-07: qty 30

## 2017-12-07 NOTE — Discharge Instructions (Addendum)
Le he recetado medicina para su acidez del estomago.Por favor tome esta mediciana todos los dias hasta que sus simptomas mejoren.Si es que tiene [place] Heightsfiebre, Retail buyersangre en sus eses, o siente dolor en el pecho regrese a la sala de Associate Professoremergencia.

## 2017-12-07 NOTE — ED Triage Notes (Signed)
Pt. Stated, Toni Atlasve been having pain in my stomach especially after I eat and pain in my back area. This started last Friday.

## 2017-12-07 NOTE — ED Provider Notes (Signed)
[facility] EMERGENCY DEPARTMENT Provider Note   CSN: [id number] Arrival date & time: 12/07/17  1038     History   Chief Complaint Chief Complaint  Patient presents with  . Abdominal Pain  . Flank Pain    HPI [name] is a 32 y.o. female.  32 y/o female with a PMH of endometriosis presents to the ED with a chief complaint of abdominal pain and diarrhea x 1 week. Patient states her pain is located in the epigastric region, she describes it as burning worse with eating.  Also reports diarrhea x1 week, she reports multiple episodes estimating 6 of them every night.  She states she had a full work-up done by her PCP two months who advised her that she had increased level of cholesterol along with fatty liver, since this visit patient reports she is been trying to eat better by managing what she eats, she states she is been eating vegetables, baked chicken, avoided spicy foods. She has not seen improvement in her symptoms.  Patient reports she has been taking advil for her pain but state the pain is very severe.  She does report a previous surgical history of a Laparoscopy were she was diagnosed with endometriosis. She denies blood in her stool, fever, nausea/vomiting or vaginal discharge.       Past Medical History:  Diagnosis Date  . Medical history non-contributory     Patient Active Problem List   Diagnosis Date Noted  . Endometriosis determined by laparoscopy 02/13/2017  . Pregnancy 12/26/2013  . NVD (normal vaginal delivery) 12/26/2013  . Fall 11/27/2013    Past Surgical History:  Procedure Laterality Date  . NO PAST SURGERIES       OB History    Gravida  3   Para  3   Term  3   Preterm  0   AB  0   Living  3     SAB  0   TAB  0   Ectopic  0   Multiple  0   Live Births  1            Home Medications    Prior to Admission medications   Medication Sig Start Date End Date Taking? Authorizing Provider  ibuprofen  (ADVIL,MOTRIN) 200 MG tablet Take 400 mg by mouth every 6 (six) hours as needed for mild pain.     [provider]  Norethindrone-Ethinyl Estradiol-Fe Biphas (LO LOESTRIN FE) 1 MG-10 MCG / 10 MCG tablet Take an ACTIVE pill daily.  She will need to pick up her packs sooner than q 3 months 02/13/17   Allie Bossier, MD  omeprazole (PRILOSEC) 20 MG capsule Take 1 capsule (20 mg total) by mouth daily. 12/07/17 01/06/18  Claude Manges, PA-C    Family History Family History  Problem Relation Age of Onset  . Alcohol abuse Neg Hx     Social History Social History   Tobacco Use  . Smoking status: Never Smoker  . Smokeless tobacco: Never Used  Substance Use Topics  . Alcohol use: No  . Drug use: No     Allergies   Patient has no known allergies.   Review of Systems Review of Systems  Constitutional: Negative for fever.  Respiratory: Negative for shortness of breath and wheezing.   Cardiovascular: Negative for chest pain.  Gastrointestinal: Positive for abdominal pain and diarrhea (multiple episodes). Negative for blood in stool, nausea and vomiting.  Genitourinary: Negative for flank pain and  hematuria.  Musculoskeletal: Negative for back pain and myalgias.  Neurological: Negative for headaches.  All other systems reviewed and are negative.    Physical Exam Updated Vital Signs BP 116/81   Pulse 68   Temp 98.2 F (36.8 C) (Oral)   Resp 16   Wt 63.5 kg   LMP 12/04/2017   SpO2 100%   BMI 27.34 kg/m   Physical Exam  Constitutional: She appears well-developed and well-nourished.  HENT:  Head: Normocephalic.  Eyes: Pupils are equal, round, and reactive to light.  Cardiovascular: Normal rate.  Pulmonary/Chest: Effort normal and breath sounds normal. She has no wheezes.  Abdominal: Normal appearance. Bowel sounds are decreased. There is tenderness in the epigastric area. There is tenderness at McBurney's point. There is no rigidity, no rebound, no guarding, no CVA  tenderness and negative Murphy's sign. No hernia.    Tenderness to palpation on epigastric region, there is no guarding, rebound or rigidity.  Skin: Skin is warm and dry.  Nursing note and vitals reviewed.    ED Treatments / Results  Labs (all labs ordered are listed, but only abnormal results are displayed) Labs Reviewed  COMPREHENSIVE METABOLIC PANEL - Abnormal; Notable for the following components:      Result Value   Glucose, Bld 106 (*)    ALT 54 (*)    All other components within normal limits  URINALYSIS, ROUTINE W REFLEX MICROSCOPIC - Abnormal; Notable for the following components:   APPearance HAZY (*)    Leukocytes, UA TRACE (*)    Bacteria, UA FEW (*)    All other components within normal limits  LIPASE, BLOOD  CBC  PREGNANCY, URINE    EKG None  Radiology Ct Abdomen Pelvis W Contrast  Result Date: 12/07/2017 CLINICAL DATA:  Burning pain in the epigastric region. Pain is worse with eating. EXAM: CT ABDOMEN AND PELVIS WITH CONTRAST TECHNIQUE: Multidetector CT imaging of the abdomen and pelvis was performed using the standard protocol following bolus administration of intravenous contrast. CONTRAST:  100mL OMNIPAQUE IOHEXOL 300 MG/ML  SOLN COMPARISON:  No comparison studies available. FINDINGS: Lower chest: Unremarkable. Hepatobiliary: No focal abnormality within the liver parenchyma. There is no evidence for gallstones, gallbladder wall thickening, or pericholecystic fluid. No intrahepatic or extrahepatic biliary dilation. Pancreas: No focal mass lesion. No dilatation of the main duct. No intraparenchymal cyst. No peripancreatic edema. Spleen: No splenomegaly. No focal mass lesion. Adrenals/Urinary Tract: No adrenal nodule or mass. Kidneys unremarkable. No evidence for hydroureter. The urinary bladder appears normal for the degree of distention. Stomach/Bowel: Stomach is nondistended. No gastric wall thickening. No evidence of outlet obstruction. Duodenum is normally  positioned as is the ligament of Treitz. No small bowel wall thickening. No small bowel dilatation. The terminal ileum is normal. The appendix is normal. No gross colonic mass. No colonic wall thickening. No substantial diverticular change. Vascular/Lymphatic: No abdominal aortic aneurysm. No abdominal aortic atherosclerotic calcification. There is no gastrohepatic or hepatoduodenal ligament lymphadenopathy. No intraperitoneal or retroperitoneal lymphadenopathy. No pelvic sidewall lymphadenopathy. Reproductive: Uterus unremarkable.  There is no adnexal mass. Other: No intraperitoneal free fluid. Musculoskeletal: No worrisome lytic or sclerotic osseous abnormality. IMPRESSION: 1. No acute findings in the abdomen or pelvis. Specifically, no findings to explain the patient's history of epigastric pain. Electronically Signed   By: Kennith CenterEric  Mansell M.D.   On: 12/07/2017 17:24    Procedures Procedures (including critical care time)  Medications Ordered in ED Medications  gi cocktail (Maalox,Lidocaine,Donnatal) (30 mLs Oral Given 12/07/17 1718)  iohexol (OMNIPAQUE) 300 MG/ML solution 100 mL (100 mLs Intravenous Contrast Given 12/07/17 1701)     Initial Impression / Assessment and Plan / ED Course  I have reviewed the triage vital signs and the nursing notes.  Pertinent labs & imaging results that were available during my care of the patient were reviewed by me and considered in my medical decision making (see chart for details).     She presents with epigastric pain which has been going on for 2 months.  Patient had a full work-up by her PCP who reported patient has fatty liver along with high cholesterol.  She has tried improving her diet but states no relieving symptoms.  She reports pain is worse after eating.  MP showed no electrolyte abnormality aside from ALT slightly elevated at 54.  BC showed no leukocytosis, hemoglobin is stable 13.9.  Lipase shows 27, urgency urine was negative, UA showed  leukocytes, bacteria, mucus present but no nitrites patient currently denies any urinary symptoms. Based on her presentation and tenderness to palpation of RLQ and diarrhea will order CT to r/o any diverticulitis, appendicitis or another significant pathology.   CT showed no clonic wall thickening, index looks normal, no acute findings.  Patient is currently afebrile believe appendicitis is less likely as a CT is reassuring and patient's vitals are stable.  She does report multiple episodes of diarrhea CT does not show any diverticulosis or diverticulitis at this time.  There is no elevation of LFTs I believe liver pathology is less likely.  There is no pain on the right upper quadrant or fever or believe any gallbladder pathology is less likely.  At this time I have the results to patient and patient will be charged home on PPI therapy to try as I believe patient likely suffering from peptic ulcer disease.  I have also advised that she needs to be followed up with her PCP in order for this therapy to improve.  I have also advised patient to stop taking all daily as this can be exacerbating her pain.  Patient stands and agrees with plan.  Her vitals have been stable throughout the course.  She states her pain is relieved with a GI cocktail, patient will for discharge.  Return precautions provided.  Final Clinical Impressions(s) / ED Diagnoses   Final diagnoses:  Peptic ulcer disease  Epigastric pain  Diarrhea, unspecified type    ED Discharge Orders         Ordered    omeprazole (PRILOSEC) 20 MG capsule  Daily     12/07/17 1853           Claude Manges, PA-C 12/07/17 1854    Terrilee Files, MD 12/09/17 1031

## 2017-12-07 NOTE — ED Notes (Signed)
Patient verbalizes understanding of discharge instructions. Opportunity for questioning and answers were provided. Ambulatory at discharge in NAD.  

## 2017-12-10 ENCOUNTER — Encounter: Payer: Self-pay | Admitting: Obstetrics & Gynecology

## 2017-12-10 ENCOUNTER — Telehealth: Payer: Self-pay

## 2017-12-10 ENCOUNTER — Ambulatory Visit: Payer: Self-pay | Admitting: Obstetrics & Gynecology

## 2017-12-10 VITALS — BP 132/86 | HR 73 | Wt 137.6 lb

## 2017-12-10 DIAGNOSIS — Z3009 Encounter for other general counseling and advice on contraception: Secondary | ICD-10-CM

## 2017-12-10 NOTE — Progress Notes (Signed)
   Subjective:    Patient ID: [name], female    DOB: 04-23-1985, 32 y.o.   MRN: [id number]  HPI 32 yo Hispanic P3 here because she would like a BTL. She declines alternative forms of contraception. She does not have a Tree surgeonsocial security card, no insurance.   Review of Systems     Objective:   Physical Exam Breathing, conversing, and ambulating normally Well nourished, well hydrated Latina, no apparent distress  abd- benign     Assessment & Plan:  Desire for sterility- I spoke with [place] who says that we don't do them at all due to expected financial losses.

## 2017-12-10 NOTE — Telephone Encounter (Signed)
Per Luna KitchensKathryn Kooistra, CNM, Called pt to inform her of the recommendation to go to the health department for contraception because the BTL will cost $10,000. Pt verbalized understanding. Pt had questions about payment plans, was told we would investigate options for her.

## 2018-04-30 ENCOUNTER — Ambulatory Visit: Payer: Self-pay | Attending: Nurse Practitioner | Admitting: Nurse Practitioner

## 2018-04-30 ENCOUNTER — Encounter: Payer: Self-pay | Admitting: Nurse Practitioner

## 2018-04-30 VITALS — BP 104/67 | HR 71 | Temp 98.2°F | Ht 61.5 in | Wt 135.0 lb

## 2018-04-30 DIAGNOSIS — E782 Mixed hyperlipidemia: Secondary | ICD-10-CM

## 2018-04-30 DIAGNOSIS — R6889 Other general symptoms and signs: Secondary | ICD-10-CM

## 2018-04-30 DIAGNOSIS — E559 Vitamin D deficiency, unspecified: Secondary | ICD-10-CM

## 2018-04-30 DIAGNOSIS — R42 Dizziness and giddiness: Secondary | ICD-10-CM

## 2018-04-30 NOTE — Patient Instructions (Addendum)
Amigdalitis Tonsillitis  La amigdalitis es una infeccin de la garganta que hace que las amgdalas se tornen rojas, sensibles e hinchadas. Las amgdalas son tejidos que se encuentran en la zona posterior de la garganta. Cada amgdala tiene grietas (criptas). En general, las amgdalas protegen al organismo de infecciones. Cules son las causas? La amigdalitis repentina [place]) puede ser causada por virus o bacterias, incluida el estreptococo. La amigdalitis de larga duracin (crnica) se produce cuando las criptas de las amgdalas se llenan con trozos de alimentos y bacterias, lo que favorece las infecciones constantes. La amigdalitis se puede transmitir de Neomia Dear persona a otra (es contagiosa). Puede propagarse si se inhalan las gotitas que se liberan al toser o Engineering geologist. Tambin se puede Forensic psychologist con los virus o las bacterias que se encuentran en las superficies, como tazas o utensilios. Cules son los signos o los sntomas? Los sntomas de esta afeccin [employer] siguientes:  Dolor de Advertising copywriter. Esto puede incluir dificultad para tragar.  Placas blancas [employer].  Amgdalas hinchadas.  Grant Ruts.  Dolor de [place].  Cansancio.  Prdida del apetito.  Ronquidos durante el sueo, cuando no los tena anteriormente.  Pequeos trozos de Youth worker (tonsilolitos), de [employer] ftido, que de vez en cuando se eliminan al toser o escupir. Estos pueden causar mal aliento. Cmo se diagnostica? Esta afeccin se diagnostica mediante un examen fsico. Se confirma con los resultados de las pruebas de laboratorio, incluido un cultivo de secreciones de Administrator. Cmo se trata? El tratamiento para esta enfermedad depende de su causa, pero, en general, se centra en tratar los sntomas asociados. El tratamiento puede incluir lo siguiente:  Medicamentos para Scientist, physiological fiebre y Engineer, materials.  Medicamentos con corticoesteroides para disminuir la  hinchazn.  Antibiticos si la enfermedad es causada por una bacteria. Si los ataques de amigdalitis son graves y frecuentes, Administrator la ciruga para extirpar las amgdalas (amigdalectoma). Siga estas indicaciones en su casa: Medicamentos  [employer] de venta libre y los recetados solamente como se lo haya indicado el mdico.  Si le recetaron un antibitico, tmelo como se lo haya indicado el mdico. No deje de tomar los antibiticos aunque comience a Actor. Comida y bebida  Beba suficiente lquido para mantener la orina clara o de color amarillo plido.  Mientras le duela la garganta, consuma alimentos blandos o lquidos, como sorbetes, sopas o bebidas instantneas para el desayuno.  Beba lquidos templados.  Tome helados de agua. Instrucciones generales  Descanse y duerma todo lo posible.  Haga grgaras con una mezcla de agua y sal 3 o 4veces al da, o cuando sea necesario. Para preparar la mezcla de agua con sal, disuelva por completo de media a 1cucharadita de sal en 1taza de agua tibia.  Lvese las manos regularmente con agua y [place]. Use desinfectante para manos si no dispone de [place] y [place].  No comparta tazas, botellas ni otros utensilios hasta que los sntomas no desaparezcan.  No fume. Esto puede ayudar a que los sntomas desaparezcan y a Automotive engineer una nueva infeccin. Si necesita ayuda para dejar de fumar, consulte al mdico.  Concurra a todas las visitas de control como se lo haya indicado el mdico. Esto es importante. Comunquese con un mdico si:  Nota que tiene bultos grandes y [employer] cuello, que no estaban antes.  Tiene fiebre que no desaparece despus de 2a3das.  Presenta una erupcin cutnea.  Tiene un catarro [address] Filter, amarillo amarronado o  con [place] Hagemansangre.  No puede tragar lquidos o alimentos durante 24 horas.  Solo una de las amgdalas est hinchada. Solicite ayuda de inmediato si:  Presenta algn sntoma  nuevo, como vmitos, dolor de cabeza intenso, rigidez en el cuello, dolor en el pecho, problemas respiratorios o dificultad para tragar.  Comienza a Financial risk analystsentir dolor de garganta ms intenso junto con babeo o cambios en la voz.  Siente un dolor intenso que no puede controlar con medicamentos.  No puede abrir completamente la boca.  Siente un dolor intenso, hinchazn o enrojecimiento en el cuello. Resumen  La amigdalitis es una infeccin de la garganta que hace que las amgdalas se tornen rojas, sensibles e hinchadas.  Esta enfermedad puede ser causada por un virus o una bacteria.  Descanse todo lo que pueda. Duerma lo suficiente. Esta informacin no tiene Theme park managercomo fin reemplazar el consejo del mdico. Asegrese de hacerle al mdico cualquier pregunta que tenga. Document Released: 12/13/2004 Document Revised: 09/03/2016 Document Reviewed: 09/03/2016 Elsevier Interactive Patient Education  2019 [employer]vier Inc.  Halitosis Halitosis La halitosis es el mal olor del aliento bucal. Las causas de la halitosis pueden ser las siguientes:  Los alimentos y bebidas.  Cuidado deficiente de la boca (higiene bucal).  Enfermedades, por ejemplo, infecciones de los senos paranasales, infecciones bucales, diabetes y enfermedad heptica y renal.  Medicamentos que resecan la boca.  Fumar. Siga estas indicaciones en su casa: Higiene bucal         Use hilo dental al menos una vez al da. Pdale al dentista que le ensee a usar el hilo Primary school teacherdental correctamente.  Cepllese los dientes por lo menos dosveces por Futures traderda. Use la pasta dental que le recomiende el dentista. Pdale al dentista que le ensee a cepillarse los dientes correctamente.  Cepllese la lengua cuando se cepilla los dientes. Esto puede ayudar a Water engineermejorar el aliento.  Enjuguese la boca por lo menos una vez por da. Use enjuague bucal que le recomiende el dentista.  Visite a su dentista para que le realice una limpieza de rutina, por lo Textron Incmenos dos  veces al ao. Comida y bebida  Beba suficiente lquido como para Pharmacologistmantener la orina de color amarillo plido.  Consuma alimentos que lo ayuden a American Standard Companiesmantener limpios los dientes, como zanahorias y apio.  No consuma alimentos ni bebidas que puedan causarle mal aliento, por ejemplo: ? Ajo. ? [place]. ? Pescado. ? Carnes. ? Caf. ? Alcohol. Instrucciones generales  No consuma ningn producto que contenga nicotina o tabaco, como cigarrillos y Administrator, Civil Servicecigarrillos electrnicos. Estos productos pueden hacer que el mal aliento empeore. Si necesita ayuda para dejar de fumar, consulte al American Expressmdico.  Si Bots[place]ausa dispositivos bucales, como dentaduras postizas o un retenedor, asegrese de usar y limpiar su dispositivo adecuadamente.  Si tiene sequedad bucal, intente mascar chicles o caramelos de menta que no contengan azcar. Mascar chicles o chupar caramelos de menta puede desencadenar la produccin de saliva. Comunquese con un mdico si:  Presenta nuevos sntomas, como sangrado en las encas o dolor.  Los sntomas empeoran o no mejoran con los Medical laboratory scientific officercuidados en el hogar. Resumen  La halitosis es el mal olor del aliento bucal. Esto tiene muchas causas posibles, como determinados alimentos y bebidas.  Asegrese de tener una buena higiene bucal. Tambin mantenga los dispositivos bucales, como los retenedores, limpios.  Beba suficiente lquido como para Pharmacologistmantener la orina de color amarillo plido.  No consuma alimentos ni bebidas que puedan causarle mal aliento, como ajo y [place]ollas.  No consuma ningn producto que contenga  nicotina o tabaco, como cigarrillos y cigarrillos electrnicos. Esta informacin no tiene Theme park managercomo fin reemplazar el consejo del mdico. Asegrese de hacerle al mdico cualquier pregunta que tenga. Document Released: 03/05/2005 Document Revised: 03/14/2017 Document Reviewed: 03/07/2017 Elsevier Interactive Patient Education  2019 Arvin[employer]vier Inc.

## 2018-05-01 LAB — LIPID PANEL
CHOLESTEROL TOTAL: 185 mg/dL (ref 100–199)
Chol/HDL Ratio: 4.6 ratio — ABNORMAL HIGH (ref 0.0–4.4)
HDL: 40 mg/dL (ref >39)
LDL CALC: 120 mg/dL — AB (ref 0–99)
Triglycerides: 127 mg/dL (ref 0–149)
VLDL CHOLESTEROL CAL: 25 mg/dL (ref 5–40)

## 2018-05-01 LAB — VITAMIN D 25 HYDROXY (VIT D DEFICIENCY, FRACTURES): Vit D, 25-Hydroxy: 16 ng/mL — ABNORMAL LOW (ref 30.0–100.0)

## 2018-05-01 LAB — TSH: TSH: 1.05 u[IU]/mL (ref 0.450–4.500)

## 2018-05-03 ENCOUNTER — Encounter: Payer: Self-pay | Admitting: Nurse Practitioner

## 2018-05-03 MED ORDER — VITAMIN D (ERGOCALCIFEROL) 1.25 MG (50000 UNIT) PO CAPS: 50000.0000 [IU] | ORAL_CAPSULE | ORAL | 1 refills | Status: DC
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
## 2018-05-06 ENCOUNTER — Telehealth: Payer: Self-pay

## 2018-05-06 NOTE — Telephone Encounter (Signed)
CMA attempt to reach patient to inform on results.  No answer and left a VM for a call back.  

## 2018-05-06 NOTE — Telephone Encounter (Signed)
-----   Message from Claiborne Rigg, NP sent at 05/03/2018  8:43 AM EST ----- Thyroid level is normal. Vitamin D is low. Will send in a weekly vitamin D prescription to the pharmacy. You will need to take this for at least 3 months.

## 2018-05-07 NOTE — Telephone Encounter (Signed)
CMA spoke to patient to inform on results.  Pt. Verified DOB. Pt. Understood.  Spanish interpreter Percell Belt [phone] assist with the call.

## 2018-05-20 ENCOUNTER — Ambulatory Visit: Payer: Self-pay | Attending: Family Medicine

## 2018-06-10 ENCOUNTER — Ambulatory Visit: Payer: Self-pay | Admitting: Nurse Practitioner

## 2019-01-17 ENCOUNTER — Encounter (HOSPITAL_COMMUNITY): Payer: Self-pay

## 2019-01-17 ENCOUNTER — Emergency Department (HOSPITAL_COMMUNITY): Payer: Self-pay

## 2019-01-17 ENCOUNTER — Other Ambulatory Visit: Payer: Self-pay

## 2019-01-17 ENCOUNTER — Emergency Department (HOSPITAL_COMMUNITY)
Admission: EM | Admit: 2019-01-17 | Discharge: 2019-01-17 | Disposition: A | Discharge status: Home / Self Care | Payer: Self-pay | Attending: Emergency Medicine | Admitting: Emergency Medicine

## 2019-01-17 DIAGNOSIS — R42 Dizziness and giddiness: Secondary | ICD-10-CM | POA: Insufficient documentation

## 2019-01-17 LAB — CBC WITH DIFFERENTIAL/PLATELET
Abs Immature Granulocytes: 0.02 10*3/uL (ref 0.00–0.07)
Basophils Absolute: 0 10*3/uL (ref 0.0–0.1)
Basophils Relative: 0 %
Eosinophils Absolute: 0.1 10*3/uL (ref 0.0–0.5)
Eosinophils Relative: 1 %
HCT: 46.6 % — ABNORMAL HIGH (ref 36.0–46.0)
Hemoglobin: 15.5 g/dL — ABNORMAL HIGH (ref 12.0–15.0)
Immature Granulocytes: 0 %
Lymphocytes Relative: 36 %
Lymphs Abs: 2.8 10*3/uL (ref 0.7–4.0)
MCH: 29.6 pg (ref 26.0–34.0)
MCHC: 33.3 g/dL (ref 30.0–36.0)
MCV: 89.1 fL (ref 80.0–100.0)
Monocytes Absolute: 0.4 10*3/uL (ref 0.1–1.0)
Monocytes Relative: 5 %
Neutro Abs: 4.4 10*3/uL (ref 1.7–7.7)
Neutrophils Relative %: 58 %
Platelets: 312 10*3/uL (ref 150–400)
RBC: 5.23 MIL/uL — ABNORMAL HIGH (ref 3.87–5.11)
RDW: 12.8 % (ref 11.5–15.5)
WBC: 7.8 10*3/uL (ref 4.0–10.5)
nRBC: 0 % (ref 0.0–0.2)

## 2019-01-17 LAB — COMPREHENSIVE METABOLIC PANEL
ALT: 25 U/L (ref 0–44)
AST: 23 U/L (ref 15–41)
Albumin: 4.2 g/dL (ref 3.5–5.0)
Alkaline Phosphatase: 50 U/L (ref 38–126)
Anion gap: 10 (ref 5–15)
BUN: 7 mg/dL (ref 6–20)
CO2: 24 mmol/L (ref 22–32)
Calcium: 9.5 mg/dL (ref 8.9–10.3)
Chloride: 104 mmol/L (ref 98–111)
Creatinine, Ser: 0.79 mg/dL (ref 0.44–1.00)
GFR calc Af Amer: >60 mL/min (ref >60)
GFR calc non Af Amer: >60 mL/min (ref >60)
Glucose, Bld: 127 mg/dL — ABNORMAL HIGH (ref 70–99)
Potassium: 3.4 mmol/L — ABNORMAL LOW (ref 3.5–5.1)
Sodium: 138 mmol/L (ref 135–145)
Total Bilirubin: 0.4 mg/dL (ref 0.3–1.2)
Total Protein: 7.7 g/dL (ref 6.5–8.1)

## 2019-01-17 LAB — I-STAT BETA HCG BLOOD, ED (MC, WL, AP ONLY): I-stat hCG, quantitative: <5 m[IU]/mL (ref <5)

## 2019-01-17 MED ORDER — METHOCARBAMOL 500 MG PO TABS: 500.0000 mg | ORAL_TABLET | Freq: Two times a day (BID) | ORAL | 0 refills | Status: DC | PRN

## 2019-01-17 MED ORDER — SODIUM CHLORIDE 0.9 % IV BOLUS
1000.0000 mL | Freq: Once | INTRAVENOUS | Status: AC
  Administered 2019-01-17: 1000 mL via INTRAVENOUS

## 2019-01-17 MED ORDER — METOCLOPRAMIDE HCL 5 MG/ML IJ SOLN
10.0000 mg | Freq: Once | INTRAMUSCULAR | Status: AC
  Administered 2019-01-17: 10 mg via INTRAVENOUS
  Filled 2019-01-17: qty 2

## 2019-01-17 MED ORDER — IBUPROFEN 800 MG PO TABS: 800.0000 mg | ORAL_TABLET | Freq: Three times a day (TID) | ORAL | 0 refills | Status: DC | PRN

## 2019-01-17 MED ORDER — KETOROLAC TROMETHAMINE 15 MG/ML IJ SOLN
30.0000 mg | Freq: Once | INTRAMUSCULAR | Status: AC
  Administered 2019-01-17: 30 mg via INTRAVENOUS
  Filled 2019-01-17 (×2): qty 2

## 2019-01-17 MED ORDER — DIAZEPAM 5 MG/ML IJ SOLN
2.5000 mg | Freq: Once | INTRAMUSCULAR | Status: AC
  Administered 2019-01-17: 2.5 mg via INTRAVENOUS
  Filled 2019-01-17: qty 2

## 2019-01-17 MED ORDER — MECLIZINE HCL 25 MG PO TABS
25.0000 mg | ORAL_TABLET | Freq: Once | ORAL | Status: AC
  Administered 2019-01-17: 25 mg via ORAL
  Filled 2019-01-17: qty 1

## 2019-01-17 MED ORDER — DIPHENHYDRAMINE HCL 50 MG/ML IJ SOLN
12.5000 mg | Freq: Once | INTRAMUSCULAR | Status: AC
  Administered 2019-01-17: 12.5 mg via INTRAVENOUS
  Filled 2019-01-17: qty 1

## 2019-01-17 MED ORDER — MECLIZINE HCL 25 MG PO TABS: 25.0000 mg | ORAL_TABLET | Freq: Three times a day (TID) | ORAL | 0 refills | Status: DC | PRN

## 2019-01-17 NOTE — Discharge Instructions (Signed)
It was my pleasure taking care of you today!   Meclizine as needed for dizziness.  Ibuprofen as needed for headache / neck pain.  Robaxin is your muscle relaxer to help with neck pain / muscle spasms.  Stay hydrated! Drink plenty of fluids!  Call your primary care doctor on Monday to schedule a follow up appointment.   Return to ER for new or worsening symptoms, any additional concerns.

## 2019-01-17 NOTE — ED Notes (Signed)
Patient transported to CT 

## 2019-01-17 NOTE — ED Notes (Signed)
Pt ambulated well 

## 2019-01-17 NOTE — ED Provider Notes (Signed)
[facility] Provider Note   CSN: [id number] Arrival date & time: 01/17/19  1221     History   Chief Complaint Chief Complaint  Patient presents with  . Weakness  . Dizziness    HPI [name] is a 33 y.o. female.     The history is provided by the patient and medical records. Language interpreter used: Offered medical interpreter; patient declines. Family aiding in interpretation as needed.  Weakness Associated symptoms: dizziness   Dizziness Associated symptoms: weakness    [name] is a 33 y.o. female  with a PMH as listed below who presents to the Emergency Department complaining of generalized weakness and dizziness which started yesterday.  Patient states that she stood up really quickly yesterday and felt as if she was going to faint.  She did not.  She sat down again quickly and started to feel little better.  She thought that this would improve just over time, but progressively got worse.  Endorses generalized headache.  Room spinning sensation every time she tries to get up and walk or if she moves her head around.  Family state they had to aid her in walking as she was unable to ambulate on her own.  Denies any history of similar.  Has some tightness at her neck.  Denies any injury.  Denies any pain with range of motion of the neck.  No fever or chills.  Denies visual changes, numbness or weakness.  No chest pain or shortness of breath.  Past Medical History:  Diagnosis Date  . Medical history non-contributory     Patient Active Problem List   Diagnosis Date Noted  . Endometriosis determined by laparoscopy 02/13/2017  . Pregnancy 12/26/2013  . NVD (normal vaginal delivery) 12/26/2013  . Fall 11/27/2013    Past Surgical History:  Procedure Laterality Date  . NO PAST SURGERIES       OB History    Gravida  3   Para  3   Term  3   Preterm  0   AB  0   Living  3     SAB  0   TAB  0    Ectopic  0   Multiple  0   Live Births  1            Home Medications    Prior to Admission medications   Medication Sig Start Date End Date Taking? Authorizing Provider  ibuprofen (ADVIL) 800 MG tablet Take 1 tablet (800 mg total) by mouth every 8 (eight) hours as needed for headache. 01/17/19   Ward, Chase PicketJaime Pilcher, PA-C  meclizine (ANTIVERT) 25 MG tablet Take 1 tablet (25 mg total) by mouth 3 (three) times daily as needed for dizziness. 01/17/19   Ward, Chase PicketJaime Pilcher, PA-C  methocarbamol (ROBAXIN) 500 MG tablet Take 1 tablet (500 mg total) by mouth 2 (two) times daily as needed (Neck pain / muscle spasms). 01/17/19   Ward, Chase PicketJaime Pilcher, PA-C  Norethindrone-Ethinyl Estradiol-Fe Biphas (LO LOESTRIN FE) 1 MG-10 MCG / 10 MCG tablet Take an ACTIVE pill daily.  She will need to pick up her packs sooner than q 3 months 02/13/17   Allie Bossierove, Myra C, MD  Vitamin D, Ergocalciferol, (DRISDOL) 1.25 MG (50000 UT) CAPS capsule Take 1 capsule (50,000 Units total) by mouth every 7 (seven) days. 05/03/18   Claiborne RiggFleming, Zelda W, NP    Family History Family History  Problem Relation Age of Onset  .  Alcohol abuse Neg Hx     Social History Social History   Tobacco Use  . Smoking status: Never Smoker  . Smokeless tobacco: Never Used  Substance Use Topics  . Alcohol use: No  . Drug use: No     Allergies   Patient has no known allergies.   Review of Systems Review of Systems  Neurological: Positive for dizziness and weakness.  All other systems reviewed and are negative.    Physical Exam Updated Vital Signs BP 100/71   Pulse 65   Temp 98.7 F (37.1 C) (Oral)   Resp 17   SpO2 99%   Physical Exam Vitals signs and nursing note reviewed.  Constitutional:      General: She is not in acute distress.    Appearance: She is well-developed.  HENT:     Head: Normocephalic and atraumatic.  Neck:     Musculoskeletal: Neck supple. No neck rigidity.     Comments: No midline cervical  tenderness.  Tenderness to bilateral paraspinal musculature as well as right trapezius.  Full range of motion.  No meningeal signs. Cardiovascular:     Rate and Rhythm: Normal rate and regular rhythm.     Heart sounds: Normal heart sounds. No murmur.  Pulmonary:     Effort: Pulmonary effort is normal. No respiratory distress.     Breath sounds: Normal breath sounds.  Abdominal:     General: There is no distension.     Palpations: Abdomen is soft.     Tenderness: There is no abdominal tenderness.  Skin:    General: Skin is warm and dry.  Neurological:     Mental Status: She is alert and oriented to person, place, and time.     Comments: Alert, oriented, thought content appropriate. Able to follow commands.  Cranial Nerves:  II:  Peripheral visual fields grossly normal, pupils equal, round, reactive to light III, IV, VI: EOM intact bilaterally, ptosis not present V,VII: smile symmetric, eyes kept closed tightly against resistance, facial light touch sensation equal VIII: hearing grossly normal IX, X: symmetric soft palate movement, uvula elevates symmetrically  XI: bilateral shoulder shrug symmetric and strong XII: midline tongue extension 5/5 muscle strength in upper and lower extremities bilaterally including strong and equal grip strength and dorsiflexion/plantar flexion Sensory to light touch normal in all four extremities.       ED Treatments / Results  Labs (all labs ordered are listed, but only abnormal results are displayed) Labs Reviewed  CBC WITH DIFFERENTIAL/PLATELET - Abnormal; Notable for the following components:      Result Value   RBC 5.23 (*)    Hemoglobin 15.5 (*)    HCT 46.6 (*)    All other components within normal limits  COMPREHENSIVE METABOLIC PANEL - Abnormal; Notable for the following components:   Potassium 3.4 (*)    Glucose, Bld 127 (*)    All other components within normal limits  I-STAT BETA HCG BLOOD, ED ([facility], [facility], [facility] ONLY)    EKG EKG  Interpretation  Date/Time:  Saturday January 17 2019 12:49:27 EDT Ventricular Rate:  84 PR Interval:    QRS Duration: 80 QT Interval:  369 QTC Calculation: 437 R Axis:   69 Text Interpretation: Sinus rhythm Confirmed by Margarita Grizzle [phone]) on 01/17/2019 2:28:46 PM   Radiology Ct Head Wo Contrast  Result Date: 01/17/2019 CLINICAL DATA:  Episodic vertigo, headache and dizziness for the past several days. EXAM: CT HEAD WITHOUT CONTRAST TECHNIQUE: Contiguous axial images were obtained  from the base of the skull through the vertex without intravenous contrast. COMPARISON:  None. FINDINGS: Brain: The ventricles are normal in size and configuration. No extra-axial fluid collections are identified. The gray-white differentiation is maintained. No CT findings for acute hemispheric infarction or intracranial hemorrhage. No mass lesions. The brainstem and cerebellum are normal. Vascular: No hyperdense vessels or obvious aneurysm. Skull: No acute skull fracture.  No bone lesion. Sinuses/Orbits: The paranasal sinuses and mastoid air cells are clear. The globes are intact. Other: No scalp lesions, laceration or hematoma. IMPRESSION: No acute intracranial findings or mass lesions. Electronically Signed   By: Marijo Sanes M.D.   On: 01/17/2019 15:06    Procedures Procedures (including critical care time)  Medications Ordered in ED Medications  sodium chloride 0.9 % bolus 1,000 mL (0 mLs Intravenous Stopped 01/17/19 1412)  meclizine (ANTIVERT) tablet 25 mg (25 mg Oral Given 01/17/19 1258)  metoCLOPramide (REGLAN) injection 10 mg (10 mg Intravenous Given 01/17/19 1410)  diphenhydrAMINE (BENADRYL) injection 12.5 mg (12.5 mg Intravenous Given 01/17/19 1411)  ketorolac (TORADOL) 15 MG/ML injection 30 mg (30 mg Intravenous Given 01/17/19 1517)  diazepam (VALIUM) injection 2.5 mg (2.5 mg Intravenous Given 01/17/19 1518)     Initial Impression / Assessment and Plan / ED Course  I have reviewed the  triage vital signs and the nursing notes.  Pertinent labs & imaging results that were available during my care of the patient were reviewed by me and considered in my medical decision making (see chart for details).       [name] is a 33 y.o. female who presents to ED for headache, dizziness and near syncope which began yesterday, but became progressively worse today. On exam, patient is afebrile, hemodynamically stable with no focal neurologic deficits.  Her room spinning sensation dizziness is reproducible with movement of the head and does acutely worsen with position changes. CT head negative. No midline cervical tenderness or meningeal signs.  She does have tenderness to bilateral paraspinal musculature of the C-spine as well as to the right trapezius which certainly could be contributing to her headache.  Labs reviewed and reassuring.  Pregnancy negative. EKG NSR. On re-eval, dizziness and headache have improved after symptomatic medications.  She has ambulated in the emergency department with steady gait.  Feels comfortable with discharged home.  Will give symptomatic medications for home as well.  Stressed importance of PCP follow-up and hydration.  Reasons to return to the emergency department were discussed with patient and family at bedside.  All questions were answered.   Patient discussed with Dr. Jeanell Sparrow who agrees with treatment plan.    Final Clinical Impressions(s) / ED Diagnoses   Final diagnoses:  Dizziness    ED Discharge Orders         Ordered    meclizine (ANTIVERT) 25 MG tablet  3 times daily PRN     01/17/19 1521    methocarbamol (ROBAXIN) 500 MG tablet  2 times daily PRN     01/17/19 1521    ibuprofen (ADVIL) 800 MG tablet  Every 8 hours PRN     01/17/19 1521           Ward, Ozella Almond, PA-C 01/17/19 1524    Pattricia Boss, MD 01/17/19 1601

## 2019-01-17 NOTE — ED Notes (Signed)
Pt verbalized understanding of discharge instructions and follow up care. Iv removed and bleeding controlled. Pt ambulatory to lobby with steady gait.

## 2019-01-17 NOTE — ED Triage Notes (Signed)
Pt reports yesterday she almost passed out. Afterwards she started having neck pain and generalized weakness. Pt also reports that she is now dizzy every time she stands up. Pt denies N/V and SHOB. Pt states she has headache and feels like it is spinning.

## 2019-02-04 ENCOUNTER — Other Ambulatory Visit: Payer: Self-pay

## 2019-02-04 ENCOUNTER — Ambulatory Visit: Payer: Self-pay | Attending: Family Medicine | Admitting: Family Medicine

## 2019-02-04 ENCOUNTER — Ambulatory Visit: Payer: Self-pay

## 2019-02-04 ENCOUNTER — Encounter: Payer: Self-pay | Admitting: Family Medicine

## 2019-02-04 VITALS — BP 116/78 | HR 71 | Temp 98.2°F | Ht 61.0 in | Wt 138.0 lb

## 2019-02-04 DIAGNOSIS — R1032 Left lower quadrant pain: Secondary | ICD-10-CM

## 2019-02-04 MED ORDER — CYCLOBENZAPRINE HCL 10 MG PO TABS: 10.0000 mg | ORAL_TABLET | Freq: Two times a day (BID) | ORAL | 1 refills | Status: DC | PRN

## 2019-02-04 NOTE — Progress Notes (Signed)
Subjective:  Patient ID: [name], female    DOB: 01-Aug-1985  Age: 33 y.o. MRN: [id number]  CC: Flank Pain (right)   HPI [name] is a 33 year old female who presents with the following complaints. She complains of 5 month history of right sided pain, intermittent and in the last one month pain has been persistent. Heavy lifting worsens her symptoms and pain radiates to right side of back. At its worst it is a 10/10 but right now is a 7/10. Causes her to be unable to sit for prolonged periods. Denies nausea, vomiting, diarrhea but sometimes has constipation; bowel movement occurs regularly but occasionally every 2-3 weeks she would be constipated ; prune juice helps.  Past Medical History:  Diagnosis Date   Medical history non-contributory     Past Surgical History:  Procedure Laterality Date   NO PAST SURGERIES      Family History  Problem Relation Age of Onset   Alcohol abuse Neg Hx     No Known Allergies  Outpatient Medications Prior to Visit  Medication Sig Dispense Refill   ibuprofen (ADVIL) 800 MG tablet Take 1 tablet (800 mg total) by mouth every 8 (eight) hours as needed for headache. 21 tablet 0   Norethindrone-Ethinyl Estradiol-Fe Biphas (LO LOESTRIN FE) 1 MG-10 MCG / 10 MCG tablet Take an ACTIVE pill daily.  She will need to pick up her packs sooner than q 3 months 3 Package 5   meclizine (ANTIVERT) 25 MG tablet Take 1 tablet (25 mg total) by mouth 3 (three) times daily as needed for dizziness. (Patient not taking: Reported on 02/04/2019) 30 tablet 0   methocarbamol (ROBAXIN) 500 MG tablet Take 1 tablet (500 mg total) by mouth 2 (two) times daily as needed (Neck pain / muscle spasms). (Patient not taking: Reported on 02/04/2019) 20 tablet 0   Vitamin D, Ergocalciferol, (DRISDOL) 1.25 MG (50000 UT) CAPS capsule Take 1 capsule (50,000 Units total) by mouth every 7 (seven) days. (Patient not taking: Reported on 02/04/2019) 12 capsule  1   No facility-administered medications prior to visit.      ROS Review of Systems  Constitutional: Negative for activity change, appetite change and fatigue.  HENT: Negative for congestion, sinus pressure and sore throat.   Eyes: Negative for visual disturbance.  Respiratory: Negative for cough, chest tightness, shortness of breath and wheezing.   Cardiovascular: Negative for chest pain and palpitations.  Gastrointestinal:       See HPI  Endocrine: Negative for polydipsia.  Genitourinary: Negative for dysuria and frequency.  Musculoskeletal: Negative for arthralgias and back pain.  Skin: Negative for rash.  Neurological: Negative for tremors, light-headedness and numbness.  Hematological: Does not bruise/bleed easily.  Psychiatric/Behavioral: Negative for agitation and behavioral problems.    Objective:  BP 116/78    Pulse 71    Temp 98.2 F (36.8 C) (Oral)    Ht 5\' 1"  (1.549 m)    Wt 138 lb (62.6 kg)    SpO2 98%    BMI 26.07 kg/m   BP/Weight 02/04/2019 01/17/2019 09/15/5282  Systolic BP 132 440 102  Diastolic BP 78 74 67  Wt. (Lbs) 138 - 135  BMI 26.07 - 25.09      Physical Exam Constitutional:      Appearance: She is well-developed.  Neck:     Vascular: No JVD.  Cardiovascular:     Rate and Rhythm: Normal rate.     Heart sounds: Normal heart sounds. No murmur.  Pulmonary:     Effort: Pulmonary effort is normal.     Breath sounds: Normal breath sounds. No wheezing or rales.  Chest:     Chest wall: No tenderness.  Abdominal:     General: Bowel sounds are normal. There is no distension.     Palpations: Abdomen is soft. There is no mass.     Tenderness: There is no abdominal tenderness.  Musculoskeletal: Normal range of motion.     Right lower leg: No edema.     Left lower leg: No edema.  Neurological:     Mental Status: She is alert and oriented to person, place, and time.  Psychiatric:        Mood and Affect: Mood normal.     CMP Latest Ref Rng &  Units 01/17/2019 12/07/2017 09/07/2015  Glucose 70 - 99 mg/dL 865(H) 846(N) 99  BUN 6 - 20 mg/dL 7 7 9   Creatinine 0.44 - 1.00 mg/dL 6.29 5.28  Sodium 135 - 145 mmol/L 138 138 139  Potassium 3.5 - 5.1 mmol/L 3.4(L) 4.0 3.6  Chloride 98 - 111 mmol/L 104 102 107  CO2 22 - 32 mmol/L 24 24 24   Calcium 8.9 - 10.3 mg/dL 9.5 9.2 9.4  Total Protein 6.5 - 8.1 g/dL 7.7 7.3 -  Total Bilirubin 0.3 - 1.2 mg/dL 0.4 0.7 -  Alkaline Phos 38 - 126 U/L 50 43 -  AST 15 - 41 U/L 23 31 -  ALT 0 - 44 U/L 25 54(H) -    Lipid Panel     Component Value Date/Time   CHOL 185 04/30/2018 1050   TRIG 127 04/30/2018 1050   HDL 40 04/30/2018 1050   CHOLHDL 4.6 (H) 04/30/2018 1050   LDLCALC 120 (H) 04/30/2018 1050    CBC    Component Value Date/Time   WBC 7.8 01/17/2019 1237   RBC 5.23 (H) 01/17/2019 1237   HGB 15.5 (H) 01/17/2019 1237   HCT 46.6 (H) 01/17/2019 1237   PLT 312 01/17/2019 1237   MCV 89.1 01/17/2019 1237   MCH 29.6 01/17/2019 1237   MCHC 33.3 01/17/2019 1237   RDW 12.8 01/17/2019 1237   LYMPHSABS 2.8 01/17/2019 1237   MONOABS 0.4 01/17/2019 1237   EOSABS 0.1 01/17/2019 1237   BASOSABS 0.0 01/17/2019 1237    No results found for: HGBA1C  Assessment & Plan:   1. Left lower quadrant abdominal pain Chronic nature of symptoms is less suspicious for acute however I will send her for ultrasound of the abdomen to exclude subacute appendicitis. We will also check labs for leukocytosis Comment Flexeril in the event that this is muscular - 01/19/2019 Abdomen Complete; Future - cyclobenzaprine (FLEXERIL) 10 MG tablet; Take 1 tablet (10 mg total) by mouth 2 (two) times daily as needed for muscle spasms.  Dispense: 60 tablet; Refill: 1 - Basic Metabolic Panel - CBC with Differential/Platelet     01/19/2019, MD, FAAFP. [facility] [place], [facility] [place]   02/04/2019, 2:55 PM

## 2019-02-04 NOTE — Progress Notes (Signed)
Patient is having pain in right side.

## 2019-02-05 LAB — CBC WITH DIFFERENTIAL/PLATELET
Basophils Absolute: 0 10*3/uL (ref 0.0–0.2)
Basos: 0 %
EOS (ABSOLUTE): 0.2 10*3/uL (ref 0.0–0.4)
Eos: 2 %
Hematocrit: 40.6 % (ref 34.0–46.6)
Hemoglobin: 13.5 g/dL (ref 11.1–15.9)
Immature Grans (Abs): 0 10*3/uL (ref 0.0–0.1)
Immature Granulocytes: 0 %
Lymphocytes Absolute: 3.2 10*3/uL — ABNORMAL HIGH (ref 0.7–3.1)
Lymphs: 33 %
MCH: 29.3 pg (ref 26.6–33.0)
MCHC: 33.3 g/dL (ref 31.5–35.7)
MCV: 88 fL (ref 79–97)
Monocytes Absolute: 0.6 10*3/uL (ref 0.1–0.9)
Monocytes: 6 %
Neutrophils Absolute: 5.8 10*3/uL (ref 1.4–7.0)
Neutrophils: 59 %
Platelets: 318 10*3/uL (ref 150–450)
RBC: 4.61 x10E6/uL (ref 3.77–5.28)
RDW: 13.1 % (ref 11.7–15.4)
WBC: 9.8 10*3/uL (ref 3.4–10.8)

## 2019-02-05 LAB — BASIC METABOLIC PANEL
BUN/Creatinine Ratio: 12 (ref 9–23)
BUN: 9 mg/dL (ref 6–20)
CO2: 23 mmol/L (ref 20–29)
Calcium: 9.2 mg/dL (ref 8.7–10.2)
Chloride: 99 mmol/L (ref 96–106)
Creatinine, Ser: 0.74 mg/dL (ref 0.57–1.00)
GFR calc Af Amer: 122 mL/min/{1.73_m2} (ref >59)
GFR calc non Af Amer: 106 mL/min/{1.73_m2} (ref >59)
Glucose: 88 mg/dL (ref 65–99)
Potassium: 3.9 mmol/L (ref 3.5–5.2)
Sodium: 139 mmol/L (ref 134–144)

## 2019-02-06 ENCOUNTER — Telehealth: Payer: Self-pay

## 2019-02-06 NOTE — Telephone Encounter (Signed)
-----   Message from Charlott Rakes, MD sent at 02/05/2019  8:45 AM EST ----- Labs are stable and do not reflect evidence of infection.

## 2019-02-06 NOTE — Telephone Encounter (Signed)
Patient name and DOB has been verified Patient was informed of lab results. Patient had no questions.  

## 2019-02-11 ENCOUNTER — Ambulatory Visit (HOSPITAL_COMMUNITY): Payer: Self-pay

## 2019-02-11 ENCOUNTER — Other Ambulatory Visit: Payer: Self-pay | Admitting: Family Medicine

## 2019-02-11 DIAGNOSIS — R1031 Right lower quadrant pain: Secondary | ICD-10-CM

## 2019-03-06 ENCOUNTER — Telehealth: Payer: Self-pay | Admitting: Nurse Practitioner

## 2019-03-06 NOTE — Telephone Encounter (Signed)
Gm Spanish  Patient called stated that she has diarrhea since  Wednesday  Severe pelvic pain no other symptoms and she has an appointment until  12/28  .  Please., call her  Thank you

## 2019-03-11 NOTE — Telephone Encounter (Signed)
Pt. Was informed to go to Urgent care or Emergency department if she is having pain. Pt was also informed to get COVID testing.

## 2019-03-16 ENCOUNTER — Ambulatory Visit: Payer: Self-pay | Attending: Nurse Practitioner | Admitting: Nurse Practitioner

## 2019-03-16 ENCOUNTER — Other Ambulatory Visit: Payer: Self-pay

## 2019-03-16 ENCOUNTER — Encounter: Payer: Self-pay | Admitting: Nurse Practitioner

## 2019-03-16 DIAGNOSIS — R42 Dizziness and giddiness: Secondary | ICD-10-CM

## 2019-03-16 NOTE — Progress Notes (Signed)
Virtual Visit via Telephone Note Due to national recommendations of social distancing due to [place] 19, telehealth visit is felt to be most appropriate for this patient at this time.  I discussed the limitations, risks, security and privacy concerns of performing an evaluation and management service by telephone and the availability of in person appointments. I also discussed with the patient that there may be a patient responsible charge related to this service. The patient expressed understanding and agreed to proceed.    I connected with [name] on 03/16/19  at   9:30 AM EST  EDT by telephone and verified that I am speaking with the correct person using two identifiers.   Consent I discussed the limitations, risks, security and privacy concerns of performing an evaluation and management service by telephone and the availability of in person appointments. I also discussed with the patient that there may be a patient responsible charge related to this service. The patient expressed understanding and agreed to proceed.   Location of Patient: Private  Residence   Location of Provider: [place] and [place] participating in Telemedicine visit: Geryl Rankins FNP-BC [place]    History of Present Illness: Telemedicine visit for: Dizziness  Occurring only during her menstrual cycles. I have explained to her that this may be due to hormonal changes that occur with the onset of menstruation. She denies any other symptoms such as otalgia, aural pressure, tinnitus, hearing loss nausea, vomiting, headache or visual disturbances.  Previous work up has been NONE.     Past Medical History:  Diagnosis Date  . Medical history non-contributory     Past Surgical History:  Procedure Laterality Date  . NO PAST SURGERIES      Family History  Problem Relation Age of Onset  . Alcohol abuse Neg Hx     Social History    Socioeconomic History  . Marital status: Married    Spouse name: Not on file  . Number of children: Not on file  . Years of education: Not on file  . Highest education level: Not on file  Occupational History  . Not on file  Tobacco Use  . Smoking status: Never Smoker  . Smokeless tobacco: Never Used  Substance and Sexual Activity  . Alcohol use: No  . Drug use: No  . Sexual activity: Yes  Other Topics Concern  . Not on file  Social History Narrative  . Not on file   Social Determinants of Health   Financial Resource Strain:   . Difficulty of Paying Living Expenses: Not on file  Food Insecurity:   . Worried About Charity fundraiser in the Last Year: Not on file  . Ran Out of Food in the Last Year: Not on file  Transportation Needs:   . Lack of Transportation (Medical): Not on file  . Lack of Transportation (Non-Medical): Not on file  Physical Activity:   . Days of Exercise per Week: Not on file  . Minutes of Exercise per Session: Not on file  Stress:   . Feeling of Stress : Not on file  Social Connections:   . Frequency of Communication with Friends and Family: Not on file  . Frequency of Social Gatherings with Friends and Family: Not on file  . Attends Religious Services: Not on file  . Active Member of Clubs or Organizations: Not on file  . Attends Archivist Meetings: Not on file  .  Marital Status: Not on file     Observations/Objective: Awake, alert and oriented x 3   Review of Systems  Constitutional: Negative for fever, malaise/fatigue and weight loss.  HENT: Negative.  Negative for nosebleeds.   Eyes: Negative.  Negative for blurred vision, double vision and photophobia.  Respiratory: Negative.  Negative for cough and shortness of breath.   Cardiovascular: Negative.  Negative for chest pain, palpitations and leg swelling.  Gastrointestinal: Negative.  Negative for heartburn, nausea and vomiting.  Musculoskeletal: Negative.  Negative for  myalgias.  Neurological: Positive for dizziness. Negative for focal weakness, seizures and headaches.  Psychiatric/Behavioral: Negative.  Negative for suicidal ideas.    Assessment and Plan: Leveda was seen today for dizziness.  Diagnoses and all orders for this visit:  Dizziness -     TSH -     CBC Drink at least 64 ounces of water daily Eliminate or decrease any caffeine intake   Follow Up Instructions Return if symptoms worsen or fail to improve.     I discussed the assessment and treatment plan with the patient. The patient was provided an opportunity to ask questions and all were answered. The patient agreed with the plan and demonstrated an understanding of the instructions.   The patient was advised to call back or seek an in-person evaluation if the symptoms worsen or if the condition fails to improve as anticipated.  I provided 17 minutes of non-face-to-face time during this encounter including median intraservice time, reviewing previous notes, labs, imaging, medications and explaining diagnosis and management.  Claiborne Rigg, FNP-BC

## 2019-03-17 ENCOUNTER — Other Ambulatory Visit: Payer: Self-pay

## 2019-03-17 ENCOUNTER — Ambulatory Visit: Payer: Self-pay | Attending: Nurse Practitioner

## 2019-03-18 LAB — CBC
Hematocrit: 43.4 % (ref 34.0–46.6)
Hemoglobin: 14.6 g/dL (ref 11.1–15.9)
MCH: 29.9 pg (ref 26.6–33.0)
MCHC: 33.6 g/dL (ref 31.5–35.7)
MCV: 89 fL (ref 79–97)
Platelets: 303 10*3/uL (ref 150–450)
RBC: 4.88 x10E6/uL (ref 3.77–5.28)
RDW: 13.1 % (ref 11.7–15.4)
WBC: 9.1 10*3/uL (ref 3.4–10.8)

## 2019-03-18 LAB — TSH: TSH: 1.36 u[IU]/mL (ref 0.450–4.500)

## 2019-03-25 ENCOUNTER — Telehealth: Payer: Self-pay | Admitting: Nurse Practitioner

## 2019-03-25 ENCOUNTER — Telehealth: Payer: Self-pay | Admitting: *Deleted

## 2019-03-25 NOTE — Telephone Encounter (Signed)
Pt called to request results, per CMA all is normal-"Your thyroid level is normal as well as your CBC does not show any anemia or abnormal red cell production.  Neither your thyroid or your red blood cells are causing your dizziness". Pt understood had no questions or concerns

## 2019-03-25 NOTE — Telephone Encounter (Signed)
Medical Assistant used [employer] to contact patient.  Interpreter Name: Standley Brooking #: [id number] Patient was not available, [employer] left patient a voicemail. Voicemail states to give a call back to [place] with [facility] at [phone].

## 2019-03-25 NOTE — Telephone Encounter (Signed)
-----   Message from Claiborne Rigg, NP sent at 03/23/2019 12:11 AM EST ----- Your thyroid level is normal as well as your CBC does not show any anemia or abnormal red cell production.  Neither your thyroid or your red blood cells are causing your dizziness.

## 2019-05-25 ENCOUNTER — Ambulatory Visit: Payer: Self-pay | Attending: Nurse Practitioner | Admitting: Nurse Practitioner

## 2019-05-25 ENCOUNTER — Other Ambulatory Visit: Payer: Self-pay

## 2019-05-25 ENCOUNTER — Encounter: Payer: Self-pay | Admitting: Nurse Practitioner

## 2019-05-25 DIAGNOSIS — J029 Acute pharyngitis, unspecified: Secondary | ICD-10-CM

## 2019-05-25 MED ORDER — FLUTICASONE PROPIONATE 50 MCG/ACT NA SUSP: 2.0000 | Freq: Every day | NASAL | 6 refills | Status: DC

## 2019-05-25 NOTE — Progress Notes (Signed)
Virtual Visit via Telephone Note Due to national recommendations of social distancing due to [place] 19, telehealth visit is felt to be most appropriate for this patient at this time.  I discussed the limitations, risks, security and privacy concerns of performing an evaluation and management service by telephone and the availability of in person appointments. I also discussed with the patient that there may be a patient responsible charge related to this service. The patient expressed understanding and agreed to proceed.    I connected with [name] on 05/25/19  at   2:10 PM EST  EDT by telephone and verified that I am speaking with the correct person using two identifiers.   Consent I discussed the limitations, risks, security and privacy concerns of performing an evaluation and management service by telephone and the availability of in person appointments. I also discussed with the patient that there may be a patient responsible charge related to this service. The patient expressed understanding and agreed to proceed.   Location of Patient: Private Residence   Location of Provider: [place] and [place] Office    Persons participating in Telemedicine visit: Toni Rankins FNP-BC [place] [name] [employer] [place] [id number]   History of Present Illness: Telemedicine visit for: Pharyngitis  Sore Throat: Patient complains of sore throat. Associated symptoms include post nasal drip and sore throat.Onset of symptoms was several weeks ago, unchanged since that time. She is drinking plenty of fluids. She has not had recent close exposure to someone with proven streptococcal pharyngitis. She also endorses a sensation of choking when she is sleeping. May need to be evaluated for sleep apnea.    Past Medical History:  Diagnosis Date  . Medical history non-contributory     Past Surgical History:  Procedure Laterality Date  . NO PAST  SURGERIES      Family History  Problem Relation Age of Onset  . Alcohol abuse Neg Hx     Social History   Socioeconomic History  . Marital status: Married    Spouse name: Not on file  . Number of children: Not on file  . Years of education: Not on file  . Highest education level: Not on file  Occupational History  . Not on file  Tobacco Use  . Smoking status: Never Smoker  . Smokeless tobacco: Never Used  Substance and Sexual Activity  . Alcohol use: No  . Drug use: No  . Sexual activity: Yes  Other Topics Concern  . Not on file  Social History Narrative  . Not on file   Social Determinants of Health   Financial Resource Strain:   . Difficulty of Paying Living Expenses: Not on file  Food Insecurity:   . Worried About Charity fundraiser in the Last Year: Not on file  . Ran Out of Food in the Last Year: Not on file  Transportation Needs:   . Lack of Transportation (Medical): Not on file  . Lack of Transportation (Non-Medical): Not on file  Physical Activity:   . Days of Exercise per Week: Not on file  . Minutes of Exercise per Session: Not on file  Stress:   . Feeling of Stress : Not on file  Social Connections:   . Frequency of Communication with Friends and Family: Not on file  . Frequency of Social Gatherings with Friends and Family: Not on file  . Attends Religious Services: Not on file  . Active Member of Clubs or  Organizations: Not on file  . Attends Banker Meetings: Not on file  . Marital Status: Not on file     Observations/Objective: Awake, alert and oriented x 3   ROS  Assessment and Plan: [name] was seen today for snoring.  Diagnoses and all orders for this visit:  Sore throat -     fluticasone (FLONASE) 50 MCG/ACT nasal spray; Place 2 sprays into both nostrils daily.     Follow Up Instructions Return in about 2 weeks (around 06/08/2019) for pharyngitis.     I discussed the assessment and treatment plan with the patient.  The patient was provided an opportunity to ask questions and all were answered. The patient agreed with the plan and demonstrated an understanding of the instructions.   The patient was advised to call back or seek an in-person evaluation if the symptoms worsen or if the condition fails to improve as anticipated.  I provided 14 minutes of non-face-to-face time during this encounter including median intraservice time, reviewing previous notes, labs, imaging, medications and explaining diagnosis and management.  Toni Rigg, FNP-BC

## 2019-06-08 ENCOUNTER — Ambulatory Visit: Payer: Self-pay | Attending: Nurse Practitioner | Admitting: Nurse Practitioner

## 2019-06-08 ENCOUNTER — Encounter: Payer: Self-pay | Admitting: Nurse Practitioner

## 2019-06-08 ENCOUNTER — Other Ambulatory Visit: Payer: Self-pay

## 2019-06-08 VITALS — BP 109/74 | HR 80 | Temp 97.7°F | Ht 61.0 in | Wt 142.0 lb

## 2019-06-08 DIAGNOSIS — G629 Polyneuropathy, unspecified: Secondary | ICD-10-CM

## 2019-06-08 DIAGNOSIS — M25531 Pain in right wrist: Secondary | ICD-10-CM

## 2019-06-08 DIAGNOSIS — R109 Unspecified abdominal pain: Secondary | ICD-10-CM

## 2019-06-08 DIAGNOSIS — G4730 Sleep apnea, unspecified: Secondary | ICD-10-CM

## 2019-06-08 DIAGNOSIS — E559 Vitamin D deficiency, unspecified: Secondary | ICD-10-CM

## 2019-06-08 NOTE — Progress Notes (Addendum)
Assessment & Plan:  [name] was seen today for follow-up.  Diagnoses and all orders for this visit:  Sleep apnea, unspecified type -     Split night study; Future  Neuropathy -     CMP14+EGFR -     B12 and Folate Panel -     Hemoglobin A1c Do not wear tight fitting shoes or thick socks with walking. Tend to put pressure on the feet and can cause numbness and tingling   Right flank pain -     Urinalysis, Complete  Vitamin D deficiency disease -     VITAMIN D 25 Hydroxy (Vit-D Deficiency, Fractures)  Right wrist pain Recommended wrist splint x6 weeks NSAIDs  Patient has been counseled on age-appropriate routine health concerns for screening and prevention. These are reviewed and up-to-date. Referrals have been placed accordingly. Immunizations are up-to-date or declined.    Subjective:   Chief Complaint  Patient presents with  . Follow-up    Pt. stated her throat pain is better. Pt. would like to discuss with PCP on snoring. Pt. stated her bone hurts.    HPI [name] 34 y.o. female presents to office today for follow up. VRI was used to communicate directly with patient for the entire encounter including providing detailed patient instructions.   She has complaints of snoring that wakes her up out of her sleep. Also endorses daytime fatigue. She does not have excessive daytime sleepiness. BMI 26 Low risk for sleep apnea.    She is questioning if her generalized joint pain is related to her depo provera States she was told that chronic depo provera use could cause bone pain. I explained to her the correlation of depo and osteoporosis with long term use. Endorses intermittent right hand pain and swelling. She denies repetitive daily activities that would cause joint pain. Pain greatest in the right wrist.   Neuropathy: She describes symptoms of numbness and tingling. Onset of symptoms was a few months ago. Symptoms are currently of mild, moderate and transient  severity. Symptoms occur with walking and last minutes. The patient denies lancinating pain and squeezing. Symptoms are symmetric.  She denies any autonomic symptoms.   Previous treatment has included NONE     Review of Systems  Constitutional: Positive for malaise/fatigue. Negative for fever and weight loss.  HENT: Negative.  Negative for nosebleeds.   Eyes: Negative.  Negative for blurred vision, double vision and photophobia.  Respiratory: Negative.  Negative for cough and shortness of breath.   Cardiovascular: Negative.  Negative for chest pain, palpitations and leg swelling.  Gastrointestinal: Negative.  Negative for heartburn, nausea and vomiting.  Genitourinary: Positive for flank pain.  Musculoskeletal: Positive for joint pain. Negative for myalgias.  Neurological: Positive for sensory change. Negative for dizziness, focal weakness, seizures and headaches.  Psychiatric/Behavioral: Negative.  Negative for suicidal ideas.    Past Medical History:  Diagnosis Date  . Medical history non-contributory     Past Surgical History:  Procedure Laterality Date  . NO PAST SURGERIES      Family History  Problem Relation Age of Onset  . Alcohol abuse Neg Hx     Social History Reviewed with no changes to be made today.   Outpatient Medications Prior to Visit  Medication Sig Dispense Refill  . cyclobenzaprine (FLEXERIL) 10 MG tablet Take 1 tablet (10 mg total) by mouth 2 (two) times daily as needed for muscle spasms. 60 tablet 1  . fluticasone (FLONASE) 50 MCG/ACT nasal spray Place  2 sprays into both nostrils daily. 16 g 6  . ibuprofen (ADVIL) 800 MG tablet Take 1 tablet (800 mg total) by mouth every 8 (eight) hours as needed for headache. 21 tablet 0  . Norethindrone-Ethinyl Estradiol-Fe Biphas (LO LOESTRIN FE) 1 MG-10 MCG / 10 MCG tablet Take an ACTIVE pill daily.  She will need to pick up her packs sooner than q 3 months 3 Package 5  . Vitamin D, Ergocalciferol, (DRISDOL) 1.25 MG  (50000 UT) CAPS capsule Take 1 capsule (50,000 Units total) by mouth every 7 (seven) days. (Patient not taking: Reported on 02/04/2019) 12 capsule 1   No facility-administered medications prior to visit.    No Known Allergies     Objective:    BP 109/74 (BP Location: Left Arm, Patient Position: Sitting, Cuff Size: Normal)   Pulse 80   Temp 97.7 F (36.5 C) (Temporal)   Ht _0  (1.549 m)   Wt 142 lb (64.4 kg)   LMP 06/04/2019   SpO2 98%   BMI 26.83 kg/m  Wt Readings from Last 3 Encounters:  06/08/19 142 lb (64.4 kg)  02/04/19 138 lb (62.6 kg)  04/30/18 135 lb (61.2 kg)    Physical Exam Vitals and nursing note reviewed.  Constitutional:      Appearance: She is well-developed.  HENT:     Head: Normocephalic and atraumatic.     Mouth/Throat:     Pharynx: Oropharynx is clear.     Tonsils: 1+ on the right. 1+ on the left.  Cardiovascular:     Rate and Rhythm: Normal rate and regular rhythm.     Heart sounds: Normal heart sounds. No murmur. No friction rub. No gallop.   Pulmonary:     Effort: Pulmonary effort is normal. No tachypnea or respiratory distress.     Breath sounds: Normal breath sounds. No decreased breath sounds, wheezing, rhonchi or rales.  Chest:     Chest wall: No tenderness.  Abdominal:     General: Bowel sounds are normal.     Palpations: Abdomen is soft.     Tenderness: There is no right CVA tenderness or left CVA tenderness.  Musculoskeletal:        General: Normal range of motion.     Cervical back: Normal range of motion.  Skin:    General: Skin is warm and dry.  Neurological:     Mental Status: She is alert and oriented to person, place, and time.     Coordination: Coordination normal.  Psychiatric:        Behavior: Behavior normal. Behavior is cooperative.        Thought Content: Thought content normal.        Judgment: Judgment normal.          Patient has been counseled extensively about nutrition and exercise as well as the  importance of adherence with medications and regular follow-up. The patient was given clear instructions to go to ER or return to medical center if symptoms don't improve, worsen or new problems develop. The patient verbalized understanding.   Follow-up: Return if symptoms worsen or fail to improve.   Gildardo Pounds, FNP-BC [facility] [place]   06/08/2019, 3:06 PM

## 2019-06-08 NOTE — Patient Instructions (Addendum)
Neuropata perifrica Peripheral Neuropathy La neuropata perifrica es un tipo de lesin nerviosa. Afecta los nervios que transportan seales entre la mdula espinal y [place], las piernas y el resto del cuerpo (nervios perifricos). No afecta los nervios de la mdula espinal ni el cerebro. En los casos de neuropata perifrica, es posible que se dae un nervio o un grupo de nervios. La neuropata perifrica es una categora amplia que incluye muchos trastornos nerviosos especficos, como la neuropata diabtica, la neuropata hereditaria y el sndrome del tnel carpiano. Cules son las causas? Esta afeccin puede ser causada por lo siguiente:  Diabetes. Esta es la causa ms comn de la neuropata perifrica.  Lesiones en los nervios.  Presin o tensin duraderas en un nervio.  Falta (deficiencia) de vitaminasB. Puede ser a causa del alcoholismo, una mala alimentacin o una restriccin en la dieta.  Infecciones.  Enfermedades autoinmunitarias, como artritis reumatoide y lupus eritematoso sistmico.  Enfermedades de los nervios que se transmiten de padres a hijos (hereditarias).  Algunos medicamentos, por ejemplo, medicamentos contra el cncer (quimioterapia).  Sustancias venenosas (txicas), como el plomo y Manufacturing engineer.  Flujo deficiente de [employer].  Enfermedad renal.  Enfermedad tiroidea. En algunos casos, se desconoce la causa de este trastorno. Cules son los signos o los sntomas? Los sntomas de esta afeccin dependern de cules nervios se hayan daado. Entre los sntomas ms frecuentes, se incluyen los siguientes:  Prdida de la sensibilidad (adormecimiento) en los pies, las manos o ambos.  Hormigueo en los pies, las manos o ambos.  Dolor urente.  Piel muy sensible.  Debilidad.  Imposibilidad para mover partes del cuerpo (parlisis).  Fasciculaciones (temblores musculares).  Torpeza o coordinacin deficiente.  Prdida del  equilibrio.  Dificultad para controlar la vejiga.  Sensacin de [employer].  Problemas sexuales. Cmo se diagnostica? Puede resultar difcil diagnosticar la neuropata perifrica y Editor, commissioning su causa. El mdico revisar sus antecedentes mdicos y le har un examen fsico. Tambin se realizar un examen neurolgico. Esto involucra controlar aquello que se vea afectado por el cerebro, la mdula espinal y los nervios (sistema nervioso). Por ejemplo, el mdico controlar sus reflejos, cmo se mueve y su grado de sensibilidad. Tambin pueden hacerle otros estudios, por ejemplo:  Anlisis de [place].  Electromiografa(EMG) y estudios de conduccin nerviosa. Estos estudios controlan la actividad nerviosa y la eficacia con la que los nervios controlan los msculos.  Estudios de diagnstico por imgenes, como una exploracin por tomografa computarizada (TC) o una resonancia magntica (RM), para descartar otras causas de los sntomas.  Extraccin de una pequea porcin de nervio para examinarla en un laboratorio (biopsia del nervio). Esto es poco frecuente.  Extraccin y evaluacin de una pequea cantidad del lquido que rodea el cerebro y la mdula espinal (puncin lumbar). Esto es poco frecuente. Cmo se trata? El tratamiento de esta afeccin puede incluir lo siguiente:  Tratamiento de la causa preexistente de la neuropata, como la diabetes, una enfermedad renal o la deficiencia de vitaminas.  Interrupcin de los [employer] pueden causar la neuropata, como la quimioterapia.  Medicamentos para Engineer, materials. Entre los medicamentos se [employer] siguientes: ? Analgsicos de venta libre o recetados. ? Medicamentos anticonvulsivos. ? Antidepresivos. ? Parches analgsicos que se colocan en las zonas de la piel que le duelen.  Cir[place] para Technical sales engineer presin en un nervio o para eliminar un nervio que est provocando dolor.  Fisioterapia para ayudar a Production manager y el  equilibrio.  Dispositivos para ayudarlo a  movilizarse (dispositivos de ayuda). Siga estas indicaciones en su casa: Medicamentos  [employer] de venta libre y los recetados solamente como se lo haya indicado el mdico. No tome otros medicamentos sin Science writer con su mdico primero.  No conduzca ni use maquinaria pesada mientras toma analgsicos recetados. Estilo de vida   No consuma ningn producto que contenga nicotina o tabaco, como cigarrillos y Administrator, Civil Service. Fumar evita que la sangre llegue a los nervios daados. Si necesita ayuda para dejar de fumar, consulte al mdico.  Evite o limite el consumo alcohol. El exceso de alcohol puede causar una deficiencia de vitaminaB, y esta es necesaria para conservar el buen [place] de los nervios.  Consuma una dieta saludable. Esto incluye lo siguiente: ? Consuma alimentos ricos en fibra, como frutas y verduras frescas, cereales integrales y frijoles. ? Limite el consumo de alimentos ricos en grasas y azcares procesados, como los alimentos fritos o dulces. Instrucciones generales   Si tiene diabetes, trabaje estrechamente con el mdico para mantener bajo control el nivel de Banker.  Si siente adormecimiento en los pies, haga lo siguiente: ? Realice controles todos los das para detectar signos de lesin o infeccin. Observe seales de enrojecimiento, calor e hinchazn. ? Use calcetines acolchados y zapatos cmodos. Estos ayudan a [employer].  Desarrolle un buen sistema de apoyo. Vivir con neuropata perifrica puede ser estresante. Considere la posibilidad de Heritage manager con un especialista en salud mental o de unirse a un grupo de [place].  Use los dispositivos de [place] y asista a fisioterapia como se lo haya indicado el mdico. Esto podra incluir el uso de un andador o un bastn.  Concurra a todas las visitas de control como se lo haya indicado el mdico. Esto es importante. Comunquese con un mdico  si:  Advierte signos o sntomas nuevos de la neuropata perifrica.  Le est costando lidiar con la neuropata perifrica desde el punto de vista emocional.  El dolor no est bajo control. Solicite ayuda de inmediato si:  Tiene una lesin o una infeccin que no se cura con normalidad.  Comienza a tener debilidad en un brazo o una pierna.  Se cae con frecuencia. Resumen  La neuropata perifrica es una afeccin en la que los nervios de los brazos o las piernas se daan, lo que provoca adormecimiento, debilidad o Engineer, mining.  Existen muchas causas de la neuropata perifrica; entre ellas, diabetes, compresin de algn nervio, deficiencia de vitaminas, enfermedad autoinmunitaria y afecciones hereditarias.  Puede resultar difcil diagnosticar la neuropata perifrica y Editor, commissioning su causa. El mdico revisar sus antecedentes mdicos y Civil engineer, contracting un examen fsico y algunos estudios, como anlisis de [place] y estudios de la [place].  El tratamiento implica tratar la causa preexistente de la neuropata y usar medicamentos para Human resources officer. La fisioterapia y los dispositivos de ayuda tambin podran ser tiles. Esta informacin no tiene Theme park manager el consejo del mdico. Asegrese de hacerle al mdico cualquier pregunta que tenga. Document Revised: 11/28/2016 Document Reviewed: 11/28/2016 Elsevier Patient Education  2020 [employer].

## 2019-06-09 LAB — HEMOGLOBIN A1C
Est. average glucose Bld gHb Est-mCnc: 108 mg/dL
Hgb A1c MFr Bld: 5.4 % (ref 4.8–5.6)

## 2019-06-09 LAB — CMP14+EGFR
ALT: 33 IU/L — ABNORMAL HIGH (ref 0–32)
AST: 25 IU/L (ref 0–40)
Albumin/Globulin Ratio: 1.4 (ref 1.2–2.2)
Albumin: 4.6 g/dL (ref 3.8–4.8)
Alkaline Phosphatase: 68 IU/L (ref 39–117)
BUN/Creatinine Ratio: 11 (ref 9–23)
BUN: 8 mg/dL (ref 6–20)
Bilirubin Total: <0.2 mg/dL (ref 0.0–1.2)
CO2: 23 mmol/L (ref 20–29)
Calcium: 9.6 mg/dL (ref 8.7–10.2)
Chloride: 100 mmol/L (ref 96–106)
Creatinine, Ser: 0.75 mg/dL (ref 0.57–1.00)
GFR calc Af Amer: 119 mL/min/{1.73_m2} (ref >59)
GFR calc non Af Amer: 104 mL/min/{1.73_m2} (ref >59)
Globulin, Total: 3.2 g/dL (ref 1.5–4.5)
Glucose: 91 mg/dL (ref 65–99)
Potassium: 4 mmol/L (ref 3.5–5.2)
Sodium: 140 mmol/L (ref 134–144)
Total Protein: 7.8 g/dL (ref 6.0–8.5)

## 2019-06-09 LAB — MICROSCOPIC EXAMINATION
Bacteria, UA: NONE SEEN
Casts: NONE SEEN /lpf

## 2019-06-09 LAB — URINALYSIS, COMPLETE
Bilirubin, UA: NEGATIVE
Glucose, UA: NEGATIVE
Ketones, UA: NEGATIVE
Leukocytes,UA: NEGATIVE
Nitrite, UA: NEGATIVE
Protein,UA: NEGATIVE
RBC, UA: NEGATIVE
Specific Gravity, UA: 1.006 (ref 1.005–1.030)
Urobilinogen, Ur: 0.2 mg/dL (ref 0.2–1.0)
pH, UA: 6 (ref 5.0–7.5)

## 2019-06-09 LAB — B12 AND FOLATE PANEL
Folate: 15 ng/mL (ref >3.0)
Vitamin B-12: 319 pg/mL (ref 232–1245)

## 2019-06-09 LAB — VITAMIN D 25 HYDROXY (VIT D DEFICIENCY, FRACTURES): Vit D, 25-Hydroxy: 15.2 ng/mL — ABNORMAL LOW (ref 30.0–100.0)

## 2019-06-10 ENCOUNTER — Other Ambulatory Visit: Payer: Self-pay | Admitting: Nurse Practitioner

## 2019-06-10 MED ORDER — VITAMIN D (ERGOCALCIFEROL) 1.25 MG (50000 UNIT) PO CAPS: 50000.0000 [IU] | ORAL_CAPSULE | ORAL | 1 refills | Status: DC

## 2019-07-01 ENCOUNTER — Other Ambulatory Visit (HOSPITAL_COMMUNITY): Payer: Self-pay

## 2019-07-04 ENCOUNTER — Encounter (HOSPITAL_BASED_OUTPATIENT_CLINIC_OR_DEPARTMENT_OTHER): Payer: Self-pay | Admitting: Internal Medicine

## 2019-07-20 NOTE — Progress Notes (Signed)
Patient ID: [name], female    DOB: 1985/12/08  MRN: [id number]  CC: Left Breast Pain  Subjective: [name] is a 34 y.o. female with history of fall, pregnancy, normal vaginal delivery, and endometriosis determined by laparoscopy who presents for breast pain.  1. LEFT BREAST PAIN:  Location: left lateral breast Description: painful on left lateral breast with burning all over the left breast especially at the nipple Pain score: 6-7/10, comes and goes When did it begin: April 2021  Has a similar situation occurred in the past: denies Discharge from nipples: denies Redness/warmth: warm sometimes Swelling: denies Where in the breast or axilla does the pain occur: left lateral breast and does not occur in the armpit  Is the pain bilateral: denies What does the pain feel like: burning How severe is the pain: does not wear a bra sometimes because the pain is so severe Is it phasic, peaks at midcycle or premenstrually: more pain closer to and during period Is it associated with the use of oral contraceptive pills or hormone replacement therapy: currently using birth control pills (not sure of name) but doesn't feel its related to this  Did it begin after a recent birth or pregnancy loss or termination: denies Is it related to vigorous or repetitive use of pectoral muscles group: denies Is there a concurrent neck, back, or shoulder problem: denies Are there systemic or other local symptoms, such as fever or erythema: denies History of recent trauma to the chest: denies Does the pain affect ability to perform daily activities of living: sometimes has to take a rest when the pain gets bad Have you taken anything for the pain: Ibuprofen, 500 mg tablets, and it helps Noticed any changes in the breast: denies Noticed any skin changes: denies Any nipple discharge: denies Family history of breast cancer: not sure  Last mammogram: none  2. PAP SMEAR:  LMP: Early  April 2021 Menses duration, flow, frequency: heavy flow, lasts about 5 days, gets a period every month Pregnancies: 3 Births: 3 Terminated pregnancies: 0 Sexual concerns: denies Sexually active: yes, 1 female Contraception use past, present: currently using birth control pills and cannot recall the name, would like to begin Depo injection  Previous STIs: denies Previous PID: denies Vaginal discharge: denies Vaginal lesions: denies Pelvic pain: during intercourse Uterine fibroids: denies Urinary incontinence: denies Bowel incontinence: denies Last PAP: can't remember but was told by the [facility] that she is due for one Testing for STIs today? Yes Testing for HIV today? Yes   Patient Active Problem List   Diagnosis Date Noted  . Endometriosis determined by laparoscopy 02/13/2017  . Pregnancy 12/26/2013  . NVD (normal vaginal delivery) 12/26/2013  . Fall 11/27/2013     Current Outpatient Medications on File Prior to Visit  Medication Sig Dispense Refill  . cyclobenzaprine (FLEXERIL) 10 MG tablet Take 1 tablet (10 mg total) by mouth 2 (two) times daily as needed for muscle spasms. 60 tablet 1  . fluticasone (FLONASE) 50 MCG/ACT nasal spray Place 2 sprays into both nostrils daily. 16 g 6  . ibuprofen (ADVIL) 800 MG tablet Take 1 tablet (800 mg total) by mouth every 8 (eight) hours as needed for headache. 21 tablet 0  . Norethindrone-Ethinyl Estradiol-Fe Biphas (LO LOESTRIN FE) 1 MG-10 MCG / 10 MCG tablet Take an ACTIVE pill daily.  She will need to pick up her packs sooner than q 3 months 3 Package 5  . Vitamin D, Ergocalciferol, (DRISDOL)  1.25 MG (50000 UNIT) CAPS capsule Take 1 capsule (50,000 Units total) by mouth every 7 (seven) days. 12 capsule 1   No current facility-administered medications on file prior to visit.    No Known Allergies  Social History   Socioeconomic History  . Marital status: Married    Spouse name: Not on file  . Number of children: Not on  file  . Years of education: Not on file  . Highest education level: Not on file  Occupational History  . Not on file  Tobacco Use  . Smoking status: Never Smoker  . Smokeless tobacco: Never Used  Substance and Sexual Activity  . Alcohol use: No  . Drug use: No  . Sexual activity: Yes  Other Topics Concern  . Not on file  Social History Narrative  . Not on file   Social Determinants of Health   Financial Resource Strain:   . Difficulty of Paying Living Expenses:   Food Insecurity:   . Worried About Charity fundraiser in the Last Year:   . Arboriculturist in the Last Year:   Transportation Needs:   . Film/video editor (Medical):   Marland Kitchen Lack of Transportation (Non-Medical):   Physical Activity:   . Days of Exercise per Week:   . Minutes of Exercise per Session:   Stress:   . Feeling of Stress :   Social Connections:   . Frequency of Communication with Friends and Family:   . Frequency of Social Gatherings with Friends and Family:   . Attends Religious Services:   . Active Member of Clubs or Organizations:   . Attends Archivist Meetings:   Marland Kitchen Marital Status:   Intimate Partner Violence:   . Fear of Current or Ex-Partner:   . Emotionally Abused:   Marland Kitchen Physically Abused:   . Sexually Abused:     Family History  Problem Relation Age of Onset  . Alcohol abuse Neg Hx     Past Surgical History:  Procedure Laterality Date  . NO PAST SURGERIES      ROS: Review of Systems Negative except as stated above  PHYSICAL EXAM: Vitals with BMI 07/23/2019 06/08/2019 02/04/2019  Height - 5\' 1"  5\' 1"   Weight 140 lbs 10 oz 142 lbs 138 lbs  BMI - 67.67 20.94  Systolic 709 628 366  Diastolic 79 74 78  Pulse 67 80 71  SpO2- 97%, room air  Temperature- 97.9 F, oral   Physical Exam General appearance - alert, well appearing, and in no distress, oriented to person, place, and time and normal appearing weight Mental status - alert, oriented to person, place, and time,  normal mood, behavior, speech, dress, motor activity, and thought processes Neck - supple, no significant adenopathy Lymphatics - no palpable lymphadenopathy, no hepatosplenomegaly Chest - clear to auscultation, no wheezes, rales or rhonchi, symmetric air entry, no tachypnea, retractions or cyanosis Heart - normal rate, regular rhythm, normal S1, S2, no murmurs, rubs, clicks or gallops Abdomen - soft, nontender, nondistended, no masses or organomegaly Breasts - breasts appear normal, no suspicious masses, no skin or nipple changes or axillary nodes, left breast at 2 o'clock tender to palpation and touch, chaperoned by Orlan Leavens, CMA Pelvic - normal external genitalia, vulva, vagina, cervix, uterus and adnexa, no palpable internal organs, exam chaperoned by Orlan Leavens, CMA Skin - normal coloration and turgor, no rashes, no suspicious skin lesions noted   Results for orders placed or performed in visit on  07/23/19  HIV antibody (with reflex)  Result Value Ref Range   HIV Screen 4th Generation wRfx Non Reactive Non Reactive  POCT urine pregnancy  Result Value Ref Range   Preg Test, Ur Negative Negative    ASSESSMENT AND PLAN: 1. Well woman exam with routine gynecological exam: -PAP with HPV reflex completed during today's visit.  -STI screening completed with PAP/HPV reflex. -Lab today for HIV antibody testing - Cytology - PAP - Cervicovaginal ancillary only - HIV antibody (with reflex)   2. Family planning, Depo-Provera contraception monitoring/administration:  -Patient requesting to discontinue birth control pills and would like to begin Depo-Provera injection.  -Urine pregnancy test negative. -Depo-Provera injection administered in clinic. Return in 3 months for repeat injection.  -Counseled patient to continue use of oral contraceptives for 1 week in order to prevent unintended pregnancy and then may discontinue use. Patient verbalized understanding.  - medroxyPROGESTERone  (DEPO-PROVERA) injection 150 mg - POCT urine pregnancy  3. Breast pain, left: -Diclofenac gel up to 4 times daily as needed for management of left breast pain.  -Discontinue use of Ibuprofen while using Diclofenac gel. -Patient reports not interested in an ultrasound or mammogram at this time related to financial concerns.  -Counseled patient if symptoms do not improve over the course of the next 2 weeks then follow-up with primary provider at which time an ultrasound or mammogram may be needed. Patient verbalized understanding. -Decreasing caffeine, wearing a underwire bra during day ad soft bra at night, and using warm/cold compress as needed may also help with breast pain. - diclofenac Sodium (VOLTAREN) 1 % GEL; Apply 2 g topically 4 (four) times daily.  Dispense: 100 g; Refill: 1  4. Language barrier: Equities trader offered during today's visit. Patient refused stating she is able to speak English well enough to understand and communicate.  Patient was given the opportunity to ask questions.  Patient verbalized understanding of the plan and was able to repeat key elements of the plan. Patient was given clear instructions to go to Emergency Department or return to medical center if symptoms don't improve, worsen, or new problems develop.The patient verbalized understanding.   Requested Prescriptions    No prescriptions requested or ordered in this encounter    Reed Eifert Jodi Geralds, NP

## 2019-07-23 ENCOUNTER — Ambulatory Visit: Payer: Self-pay | Attending: Family | Admitting: Family

## 2019-07-23 ENCOUNTER — Other Ambulatory Visit: Payer: Self-pay

## 2019-07-23 ENCOUNTER — Encounter: Payer: Self-pay | Admitting: Family

## 2019-07-23 VITALS — BP 119/79 | HR 67 | Temp 97.9°F | Resp 16 | Wt 140.6 lb

## 2019-07-23 DIAGNOSIS — Z3042 Encounter for surveillance of injectable contraceptive: Secondary | ICD-10-CM

## 2019-07-23 DIAGNOSIS — Z01419 Encounter for gynecological examination (general) (routine) without abnormal findings: Secondary | ICD-10-CM

## 2019-07-23 DIAGNOSIS — N644 Mastodynia: Secondary | ICD-10-CM

## 2019-07-23 DIAGNOSIS — Z789 Other specified health status: Secondary | ICD-10-CM

## 2019-07-23 LAB — POCT URINE PREGNANCY: Preg Test, Ur: NEGATIVE

## 2019-07-23 MED ORDER — DICLOFENAC SODIUM 1 % EX GEL: 2.0000 g | Freq: Four times a day (QID) | CUTANEOUS | 1 refills | Status: DC

## 2019-07-23 MED ORDER — MEDROXYPROGESTERONE ACETATE 150 MG/ML IM SUSP
150.0000 mg | Freq: Once | INTRAMUSCULAR | Status: AC
  Administered 2019-07-23: 11:00:00 150 mg via INTRAMUSCULAR

## 2019-07-23 NOTE — Patient Instructions (Addendum)
Diclofenac gel for breast pain. PAP smear done today. Lab today. Depo injection given today. Follow-up with primary provider as scheduled. Pap Test Why am I having this test? A Pap test, also called a Pap smear, is a screening test to check for signs of:  Cancer of the vagina, cervix, and uterus. The cervix is the lower part of the uterus that opens into the vagina.  Infection.  Changes that may be a sign that cancer is developing (precancerous changes). Women need this test on a regular basis. In general, you should have a Pap test every 3 years until you reach menopause or age 71. Women aged 30-60 may choose to have their Pap test done at the same time as an HPV (human papillomavirus) test every 5 years (instead of every 3 years). Your health care provider may recommend having Pap tests more or less often depending on your medical conditions and past Pap test results. What kind of sample is taken?  Your health care provider will collect a sample of cells from the surface of your cervix. This will be done using a small cotton swab, plastic spatula, or brush. This sample is often collected during a pelvic exam, when you are lying on your back on an exam table with feet in footrests (stirrups). In some cases, fluids (secretions) from the cervix or vagina may also be collected. How do I prepare for this test?  Be aware of where you are in your menstrual cycle. If you are menstruating on the day of the test, you may be asked to reschedule.  You may need to reschedule if you have a known vaginal infection on the day of the test.  Follow instructions from your health care provider about: ? Changing or stopping your regular medicines. Some medicines can cause abnormal test results, such as digitalis and tetracycline. ? Avoiding douching or taking a bath the day before or the day of the test. Tell a health care provider about:  Any allergies you have.  All medicines you are taking, including  vitamins, herbs, eye drops, creams, and over-the-counter medicines.  Any blood disorders you have.  Any surgeries you have had.  Any medical conditions you have.  Whether you are pregnant or may be pregnant. How are the results reported? Your test results will be reported as either abnormal or normal. A false-positive result can occur. A false positive is incorrect because it means that a condition is present when it is not. A false-negative result can occur. A false negative is incorrect because it means that a condition is not present when it is. What do the results mean? A normal test result means that you do not have signs of cancer of the vagina, cervix, or uterus. An abnormal result may mean that you have:  Cancer. A Pap test by itself is not enough to diagnose cancer. You will have more tests done in this case.  Precancerous changes in your vagina, cervix, or uterus.  Inflammation of the cervix.  An STD (sexually transmitted disease).  A fungal infection.  A parasite infection. Talk with your health care provider about what your results mean. Questions to ask your health care provider Ask your health care provider, or the department that is doing the test:  When will my results be ready?  How will I get my results?  What are my treatment options?  What other tests do I need?  What are my next steps? Summary  In general, women should have  a Pap test every 3 years until they reach menopause or age 81.  Your health care provider will collect a sample of cells from the surface of your cervix. This will be done using a small cotton swab, plastic spatula, or brush.  In some cases, fluids (secretions) from the cervix or vagina may also be collected. This information is not intended to replace advice given to you by your health care provider. Make sure you discuss any questions you have with your health care provider. Document Revised: 11/12/2016 Document Reviewed:  11/12/2016 Elsevier Patient Education  2020 Elsevier Inc.   Medroxyprogesterone injection [Contraceptive] What is this medicine? MEDROXYPROGESTERONE (me DROX ee proe JES te rone) contraceptive injections prevent pregnancy. They provide effective birth control for 3 months. Depo-subQ Provera 104 is also used for treating pain related to endometriosis. This medicine may be used for other purposes; ask your health care provider or pharmacist if you have questions. COMMON BRAND NAME(S): Depo-Provera, Depo-subQ Provera 104 What should I tell my health care provider before I take this medicine? They need to know if you have any of these conditions:  frequently drink alcohol  asthma  blood vessel disease or a history of a blood clot in the lungs or legs  bone disease such as osteoporosis  breast cancer  diabetes  eating disorder (anorexia nervosa or bulimia)  high blood pressure  HIV infection or AIDS  kidney disease  liver disease  mental depression  migraine  seizures (convulsions)  stroke  tobacco smoker  vaginal bleeding  an unusual or allergic reaction to medroxyprogesterone, other hormones, medicines, foods, dyes, or preservatives  pregnant or trying to get pregnant  breast-feeding How should I use this medicine? Depo-Provera Contraceptive injection is given into a muscle. Depo-subQ Provera 104 injection is given under the skin. These injections are given by a health care professional. You must not be pregnant before getting an injection. The injection is usually given during the first 5 days after the start of a menstrual period or 6 weeks after delivery of a baby. Talk to your pediatrician regarding the use of this medicine in children. Special care may be needed. These injections have been used in female children who have started having menstrual periods. Overdosage: If you think you have taken too much of this medicine contact a poison control center or  emergency room at once. NOTE: This medicine is only for you. Do not share this medicine with others. What if I miss a dose? Try not to miss a dose. You must get an injection once every 3 months to maintain birth control. If you cannot keep an appointment, call and reschedule it. If you wait longer than 13 weeks between Depo-Provera contraceptive injections or longer than 14 weeks between Depo-subQ Provera 104 injections, you could get pregnant. Use another method for birth control if you miss your appointment. You may also need a pregnancy test before receiving another injection. What may interact with this medicine? Do not take this medicine with any of the following medications:  bosentan This medicine may also interact with the following medications:  aminoglutethimide  antibiotics or medicines for infections, especially rifampin, rifabutin, rifapentine, and griseofulvin  aprepitant  barbiturate medicines such as phenobarbital or primidone  bexarotene  carbamazepine  medicines for seizures like ethotoin, felbamate, oxcarbazepine, phenytoin, topiramate  modafinil  St. John's wort This list may not describe all possible interactions. Give your health care provider a list of all the medicines, herbs, non-prescription drugs, or dietary supplements  you use. Also tell them if you smoke, drink alcohol, or use illegal drugs. Some items may interact with your medicine. What should I watch for while using this medicine? This drug does not protect you against HIV infection (AIDS) or other sexually transmitted diseases. Use of this product may cause you to lose calcium from your bones. Loss of calcium may cause weak bones (osteoporosis). Only use this product for more than 2 years if other forms of birth control are not right for you. The longer you use this product for birth control the more likely you will be at risk for weak bones. Ask your health care professional how you can keep strong  bones. You may have a change in bleeding pattern or irregular periods. Many females stop having periods while taking this drug. If you have received your injections on time, your chance of being pregnant is very low. If you think you may be pregnant, see your health care professional as soon as possible. Tell your health care professional if you want to get pregnant within the next year. The effect of this medicine may last a long time after you get your last injection. What side effects may I notice from receiving this medicine? Side effects that you should report to your doctor or health care professional as soon as possible:  allergic reactions like skin rash, itching or hives, swelling of the face, lips, or tongue  breast tenderness or discharge  breathing problems  changes in vision  depression  feeling faint or lightheaded, falls  fever  pain in the abdomen, chest, groin, or leg  problems with balance, talking, walking  unusually weak or tired  yellowing of the eyes or skin Side effects that usually do not require medical attention (report to your doctor or health care professional if they continue or are bothersome):  acne  fluid retention and swelling  headache  irregular periods, spotting, or absent periods  temporary pain, itching, or skin reaction at site where injected  weight gain This list may not describe all possible side effects. Call your doctor for medical advice about side effects. You may report side effects to FDA at 1-800-FDA-1088. Where should I keep my medicine? This does not apply. The injection will be given to you by a health care professional. NOTE: This sheet is a summary. It may not cover all possible information. If you have questions about this medicine, talk to your doctor, pharmacist, or health care provider.  2020 Elsevier/Gold Standard (2008-03-26 18:37:56)

## 2019-07-24 LAB — CERVICOVAGINAL ANCILLARY ONLY
Bacterial Vaginitis (gardnerella): NEGATIVE
Candida Glabrata: NEGATIVE
Candida Vaginitis: NEGATIVE
Chlamydia: NEGATIVE
Comment: NEGATIVE
Comment: NEGATIVE
Comment: NEGATIVE
Comment: NEGATIVE
Comment: NEGATIVE
Comment: NORMAL
Neisseria Gonorrhea: NEGATIVE
Trichomonas: NEGATIVE

## 2019-07-24 LAB — HIV ANTIBODY (ROUTINE TESTING W REFLEX): HIV Screen 4th Generation wRfx: NONREACTIVE

## 2019-07-28 LAB — CYTOLOGY - PAP
Comment: NEGATIVE
Diagnosis: NEGATIVE
High risk HPV: NEGATIVE

## 2019-07-28 NOTE — Progress Notes (Signed)
PAP normal.  HIV, syphilis, gonorrhea, chlamydia, yeast infection, bacterial vaginosis, trichomonas, and pregnancy test negative.

## 2019-07-29 ENCOUNTER — Telehealth (INDEPENDENT_AMBULATORY_CARE_PROVIDER_SITE_OTHER): Payer: Self-pay

## 2019-07-29 NOTE — Telephone Encounter (Signed)
Contacted pt to go over pap results pt didn't answer left a detailed vm informing pt of results and if she has any questions or concerns to give a call

## 2019-08-10 ENCOUNTER — Other Ambulatory Visit: Payer: Self-pay

## 2019-08-10 ENCOUNTER — Ambulatory Visit: Payer: Self-pay | Attending: Nurse Practitioner

## 2019-08-10 ENCOUNTER — Ambulatory Visit (HOSPITAL_BASED_OUTPATIENT_CLINIC_OR_DEPARTMENT_OTHER): Payer: Self-pay | Admitting: Family

## 2019-08-10 ENCOUNTER — Encounter: Payer: Self-pay | Admitting: Family

## 2019-08-10 VITALS — BP 129/81 | HR 72 | Temp 97.7°F | Resp 16

## 2019-08-10 DIAGNOSIS — K0889 Other specified disorders of teeth and supporting structures: Secondary | ICD-10-CM

## 2019-08-10 NOTE — Progress Notes (Signed)
Patient ID: [name], female    DOB: 1985-07-21  MRN: [id number]  CC: Teeth pain  Subjective: Charli Villalta Shirley Friar is a 34 y.o. female with history of endometriosis determined by laparascopy who presents for ear pain.  1. Teeth & Oral Pain:   Onset: 4 days ago Gum Pain: Yes []  No [x]  Tooth Pain: Yes [x]  No []  Pain Score: 10/10 and lasts all day Gum Swelling: Yes []  No [x]  Gum Bleeding: Yes []  No [x]  Gum Redness: Yes []  No [x]  Tongue Pain: Yes [x]  No []  Tongue Swollen: Yes []  No [x]  Tonsil Swelling: Yes []  No [x]  Bad Breath: Yes [x]  No []  Jaw Pain: Yes [x]  No []  Neck Pain: Yes [x]  No []  Ear Pain: Yes, bilateral ears  Fever: Yes []  No [x]  Sore Throat: Yes [x]  No []  Cough: Yes []  No [x]  Shortness of Breath: Yes []  No [x]  Nausea: Yes []  No [x]  Vomiting: Yes []  No [x]  Chest Pain: Yes []  No [x]  Aggravating Factors:  Relieving Factors:  Ibuprofen 800 mg, every 2 hours, does not help much Comments: Reports when she opens her mouth her jaw hurts. Reports she is eating in small portions and drinking liquids as normal. Reports she had a similar toothache about 2 months ago related to what she believes to be her wisdom teeth. States at that time she saw a provider and was told the ear pain and tooth pain was related to stress. Patient reports she didn't believe that to be true because she does not have any sources of stress. Reports she has an appointment with dentist in early June but would rather be seen earlier. States she wants her 4 wisdom teeth removed.    Patient Active Problem List   Diagnosis Date Noted  . Endometriosis determined by laparoscopy 02/13/2017  . Pregnancy 12/26/2013  . NVD (normal vaginal delivery) 12/26/2013  . Fall 11/27/2013     Current Outpatient Medications on File Prior to Visit  Medication Sig Dispense Refill  . cyclobenzaprine (FLEXERIL) 10 MG tablet Take 1 tablet (10 mg total) by mouth 2 (two) times daily as needed for muscle spasms.  (Patient not taking: Reported on 07/23/2019) 60 tablet 1  . diclofenac Sodium (VOLTAREN) 1 % GEL Apply 2 g topically 4 (four) times daily. 100 g 1  . fluticasone (FLONASE) 50 MCG/ACT nasal spray Place 2 sprays into both nostrils daily. 16 g 6  . ibuprofen (ADVIL) 800 MG tablet Take 1 tablet (800 mg total) by mouth every 8 (eight) hours as needed for headache. 21 tablet 0  . Norethindrone-Ethinyl Estradiol-Fe Biphas (LO LOESTRIN FE) 1 MG-10 MCG / 10 MCG tablet Take an ACTIVE pill daily.  She will need to pick up her packs sooner than q 3 months 3 Package 5  . Vitamin D, Ergocalciferol, (DRISDOL) 1.25 MG (50000 UNIT) CAPS capsule Take 1 capsule (50,000 Units total) by mouth every 7 (seven) days. 12 capsule 1   No current facility-administered medications on file prior to visit.    No Known Allergies  Social History   Socioeconomic History  . Marital status: Married    Spouse name: Not on file  . Number of children: Not on file  . Years of education: Not on file  . Highest education level: Not on file  Occupational History  . Not on file  Tobacco Use  . Smoking status: Never Smoker  . Smokeless tobacco: Never Used  Substance and Sexual Activity  . Alcohol use: No  .  Drug use: No  . Sexual activity: Yes  Other Topics Concern  . Not on file  Social History Narrative  . Not on file   Social Determinants of Health   Financial Resource Strain:   . Difficulty of Paying Living Expenses:   Food Insecurity:   . Worried About Charity fundraiser in the Last Year:   . Arboriculturist in the Last Year:   Transportation Needs:   . Film/video editor (Medical):   Marland Kitchen Lack of Transportation (Non-Medical):   Physical Activity:   . Days of Exercise per Week:   . Minutes of Exercise per Session:   Stress:   . Feeling of Stress :   Social Connections:   . Frequency of Communication with Friends and Family:   . Frequency of Social Gatherings with Friends and Family:   . Attends  Religious Services:   . Active Member of Clubs or Organizations:   . Attends Archivist Meetings:   Marland Kitchen Marital Status:   Intimate Partner Violence:   . Fear of Current or Ex-Partner:   . Emotionally Abused:   Marland Kitchen Physically Abused:   . Sexually Abused:     Family History  Problem Relation Age of Onset  . Alcohol abuse Neg Hx     Past Surgical History:  Procedure Laterality Date  . NO PAST SURGERIES      ROS: Review of Systems Negative except as stated above  PHYSICAL EXAM: Vitals with BMI 08/10/2019 07/23/2019 06/08/2019  Height - - 5\' 1"   Weight - 140 lbs 10 oz 142 lbs  BMI - - 11.91  Systolic 478 295 621  Diastolic 81 79 74  Pulse 72 67 80  SpO2- 98%, room air  Temperature- 97.34F, oral  Physical Exam General appearance - alert, well appearing, and in no distress and oriented to person, place, and time Mental status - alert, oriented to person, place, and time, normal mood, behavior, speech, dress, motor activity, and thought processes Eyes - pupils equal and reactive, extraocular eye movements intact Ears - bilateral TM's and external ear canals normal, bilateral ear pain with grimacing during exam with otoscope Nose - normal and patent, no erythema, discharge or polyps and normal nontender sinuses Mouth - mucous membranes moist, pharynx normal without lesions, tongue normal, TMJ abnormal with bilateral tenderness, 1 wisdom tooth right upper gum, 1 wisdom tooth left upper gum, 1 wisdom tooth left lower gum, 1 wisdom tooth right lower gum Neck - supple, no significant adenopathy, bilateral neck tenderness with palpation  Lymphatics - no palpable lymphadenopathy, no hepatosplenomegaly Chest - clear to auscultation, no wheezes, rales or rhonchi, symmetric air entry, no tachypnea, retractions or cyanosis Heart - normal rate, regular rhythm, normal S1, S2, no murmurs, rubs, clicks or gallops  ASSESSMENT AND PLAN: 1. Pain in a tooth or teeth: -Patient presents today  with wisdom teeth pain. Reports she has experienced pain from wisdom teeth in the past. All 4 wisdom teeth present and none seem to be impacted. Reports she has a dentist appointment in early June for wisdom teeth removal but that she cannot wait until then to be seen. Requesting referral to dentist. -Today patient is being referred to [facility]. Office located at [address], [address]. Informed patient this is a walk-in clinic and no appointment needed. Patient agreeable.  Patient was given the opportunity to ask questions.  Patient verbalized understanding of the plan and was able  to repeat key elements of the plan. Patient was given clear instructions to go to Emergency Department or return to medical center if symptoms don't improve, worsen, or new problems develop.The patient verbalized understanding.  Rema Fendt, NP

## 2019-08-10 NOTE — Patient Instructions (Addendum)
Referral to [facility] for wisdom teeth extraction.  Earache, Adult An earache, or ear pain, can be caused by many things, including:  An infection.  Ear wax buildup.  Ear pressure.  Something in the ear that should not be there (foreign body).  A sore throat.  Tooth problems.  Jaw problems. Treatment of the earache will depend on the cause. If the cause is not clear or cannot be determined, you may need to watch your symptoms until your earache goes away or until a cause is found. Follow these instructions at home: Medicines  Take or apply over-the-counter and prescription medicines only as told by your health care provider.  If you were prescribed an antibiotic medicine, use it as told by your health care provider. Do not stop using the antibiotic even if you start to feel better.  Do not put anything in your ear other than medicine that is prescribed by your health care provider. Managing pain If directed, apply heat to the affected area as often as told by your health care provider. Use the heat source that your health care provider recommends, such as a moist heat pack or a heating pad.  Place a towel between your skin and the heat source.  Leave the heat on for 20-30 minutes.  Remove the heat if your skin turns bright red. This is especially important if you are unable to feel pain, heat, or cold. You may have a greater risk of getting burned. If directed, put ice on the affected area as often as told by your health care provider. To do this:      Put ice in a plastic bag.  Place a towel between your skin and the bag.  Leave the ice on for 20 minutes, 2-3 times a day. General instructions  Pay attention to any changes in your symptoms.  Try resting in an upright position instead of lying down. This may help to reduce pressure in your ear and relieve pain.  Chew gum if it helps to relieve your ear pain.  Treat any allergies as told by your health care  provider.  Drink enough fluid to keep your urine pale yellow.  It is up to you to get the results of any tests that were done. Ask your health care provider, or the department that is doing the tests, when your results will be ready.  Keep all follow-up visits as told by your health care provider. This is important. Contact a health care provider if:  Your pain does not improve within 2 days.  Your earache gets worse.  You have new symptoms.  You have a fever. Get help right away if you:  Have a severe headache.  Have a stiff neck.  Have trouble swallowing.  Have redness or swelling behind your ear.  Have fluid or blood coming from your ear.  Have hearing loss.  Feel dizzy. Summary  An earache, or ear pain, can be caused by many things.  Treatment of the earache will depend on the cause. Follow recommendations from your health care provider to treat your ear pain.  If the cause is not clear or cannot be determined, you may need to watch your symptoms until your earache goes away or until a cause is found.  Keep all follow-up visits as told by your health care provider. This is important. This information is not intended to replace advice given to you by your health care provider. Make sure you discuss any questions you  have with your health care provider. Document Revised: 10/11/2018 Document Reviewed: 10/11/2018 Elsevier Patient Education  2020 [employer].

## 2019-08-13 ENCOUNTER — Ambulatory Visit: Payer: Self-pay

## 2019-09-22 ENCOUNTER — Ambulatory Visit: Payer: Self-pay | Attending: Nurse Practitioner

## 2019-09-22 ENCOUNTER — Other Ambulatory Visit: Payer: Self-pay

## 2019-10-09 ENCOUNTER — Other Ambulatory Visit: Payer: Self-pay

## 2019-10-09 ENCOUNTER — Ambulatory Visit: Payer: Self-pay | Attending: Nurse Practitioner

## 2019-10-09 VITALS — Temp 97.8°F | Wt 144.0 lb

## 2019-10-09 DIAGNOSIS — Z3042 Encounter for surveillance of injectable contraceptive: Secondary | ICD-10-CM

## 2019-10-09 MED ORDER — MEDROXYPROGESTERONE ACETATE 150 MG/ML IM SUSP
150.0000 mg | Freq: Once | INTRAMUSCULAR | Status: AC
  Administered 2019-10-09: 150 mg via INTRAMUSCULAR

## 2019-10-09 NOTE — Progress Notes (Signed)
Pt is here as instructed by PCP for Depo injections  Date last pap:  Last Depo-Provera: 07/23/2019 Side Effects if any: None verbalized  Serum HCG indicated? YES/ Negative   Pt is aware that once she receives the Depo-Provera that she does not need to continue use of OCPs. Cautioned pt that if she had intercourse in the last two weeks she could be pregnant even though the test shows a negative result.  Education given to pt in regard to use extra protection and possible false negative HGC if antibiotic intake or missed intervals. Verbalized understanding Depo-Provera 150 mg IM given in R gluteus Medius   muscle. Injection well tolerated. No reaction noted at the injection site. Explained pt the importance adherence to depo Provera perpetual calendar. Patient education was provided. Next appointment scheduled for October 8

## 2019-12-25 ENCOUNTER — Other Ambulatory Visit: Payer: Self-pay

## 2019-12-25 ENCOUNTER — Other Ambulatory Visit: Payer: Self-pay | Admitting: Nurse Practitioner

## 2019-12-25 ENCOUNTER — Ambulatory Visit: Payer: Self-pay | Attending: Nurse Practitioner

## 2019-12-25 VITALS — Temp 97.3°F

## 2019-12-25 DIAGNOSIS — Z30019 Encounter for initial prescription of contraceptives, unspecified: Secondary | ICD-10-CM

## 2019-12-25 LAB — POCT URINE PREGNANCY: Preg Test, Ur: NEGATIVE

## 2019-12-25 MED ORDER — NORETHIN-ETH ESTRAD-FE BIPHAS 1 MG-10 MCG / 10 MCG PO TABS: ORAL_TABLET | ORAL | 11 refills | Status: DC

## 2019-12-25 NOTE — Progress Notes (Signed)
Pt in clinic for depo injection but reports at beginning of visit that she no longer wishes to have injections and requests to be placed back on OCPs. Reports severe abdominal pain since starting depo injections and stated that she had this previously w/ OCPs but became worse w/ depo, prefers to take OCPs. Urine HCG performed. Per provider consult last OCP script refilled and sent to [facility] pharmacy, pt aware and will pick up script.

## 2020-02-15 ENCOUNTER — Telehealth: Payer: Self-pay

## 2020-02-15 NOTE — Telephone Encounter (Signed)
Pt would like a call in regards to financial assistance!

## 2020-02-16 ENCOUNTER — Telehealth: Payer: Self-pay | Admitting: Nurse Practitioner

## 2020-02-16 NOTE — Telephone Encounter (Signed)
I return pt call, she just wan tot confirm her appt for 02/18/20

## 2020-02-18 ENCOUNTER — Ambulatory Visit: Payer: Self-pay | Attending: Nurse Practitioner

## 2020-02-18 ENCOUNTER — Ambulatory Visit (HOSPITAL_BASED_OUTPATIENT_CLINIC_OR_DEPARTMENT_OTHER): Payer: Self-pay | Admitting: Pharmacist

## 2020-02-18 ENCOUNTER — Other Ambulatory Visit: Payer: Self-pay

## 2020-02-18 DIAGNOSIS — Z23 Encounter for immunization: Secondary | ICD-10-CM

## 2020-02-18 NOTE — Progress Notes (Signed)
Patient presents for vaccination against influenza per orders of Zelda. Consent given. Counseling provided. No contraindications exists. Vaccine administered without incident.   Luke Van Ausdall, PharmD, BCACP, CPP Clinical Pharmacist [facility] [phone]  

## 2020-03-05 IMAGING — CT CT HEAD W/O CM
4 series · 16 of 47 positions shown, 18 images · non-contrast
Comparison: None.

CLINICAL DATA: Episodic vertigo, headache and dizziness for the
past several days.

EXAM:
CT HEAD WITHOUT CONTRAST
TECHNIQUE: Contiguous axial images were obtained from the base of the skull
through the vertex without intravenous contrast.

[Series 3: head without · axial · non-contrast · 0.41mm/px · z∈[-97,+13]mm · 7 of 30 slices shown, 9 images]
[im 4/30  brain]
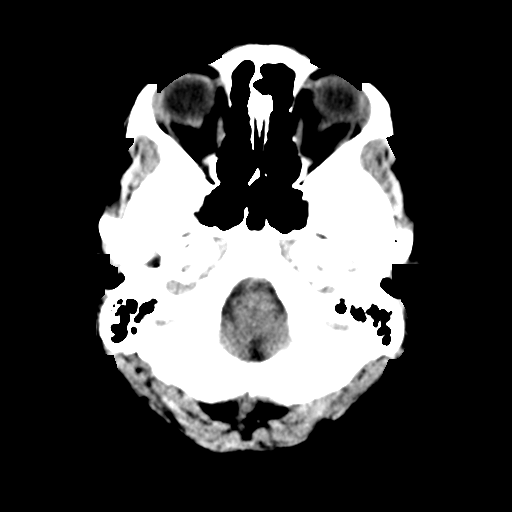
[im 4/30  bone]
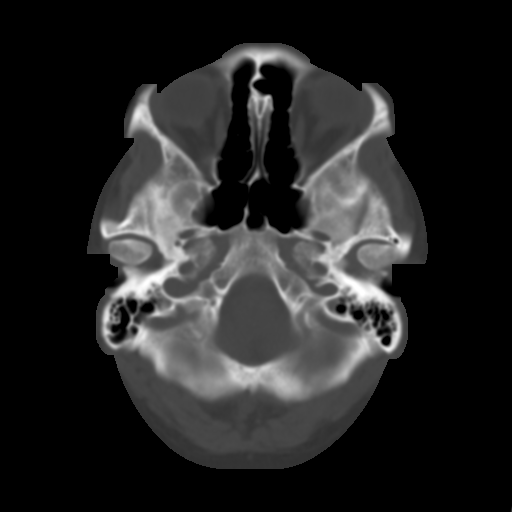
[im 8/30  brain]
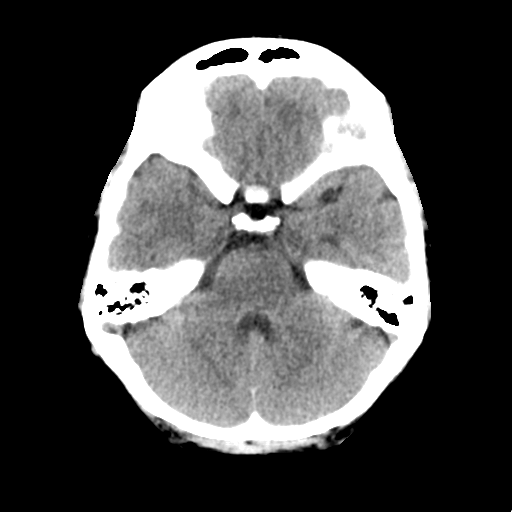
[im 11/30  brain]
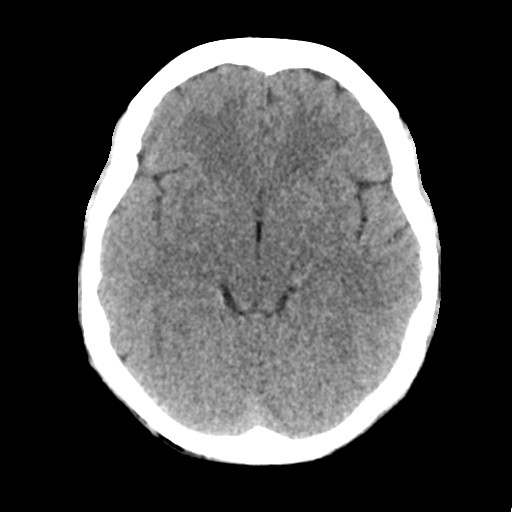
[im 15/30  brain]
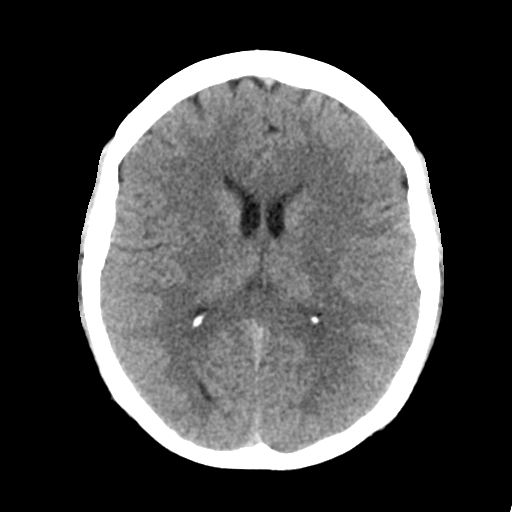
[im 19/30  brain]
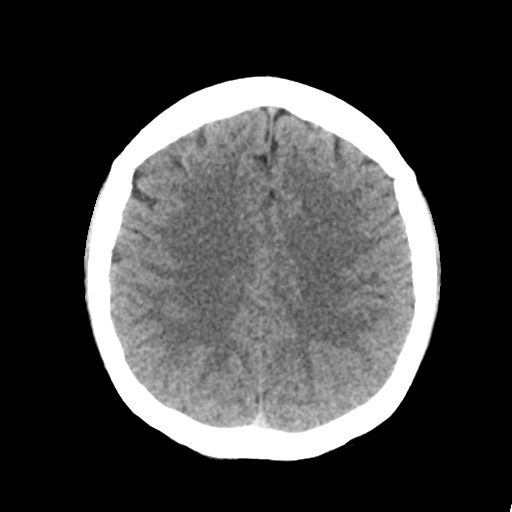
[im 19/30  bone]
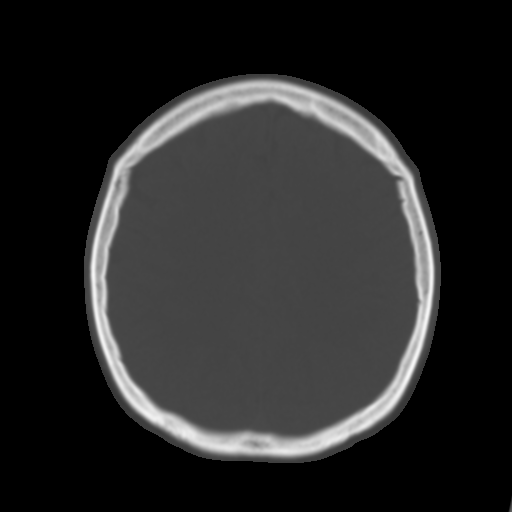
[im 22/30  brain]
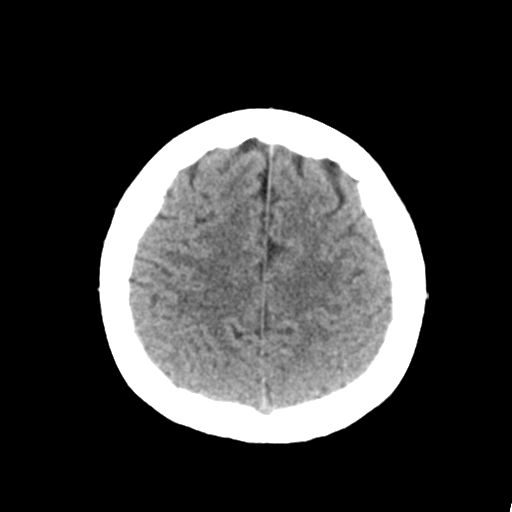
[im 26/30  brain]
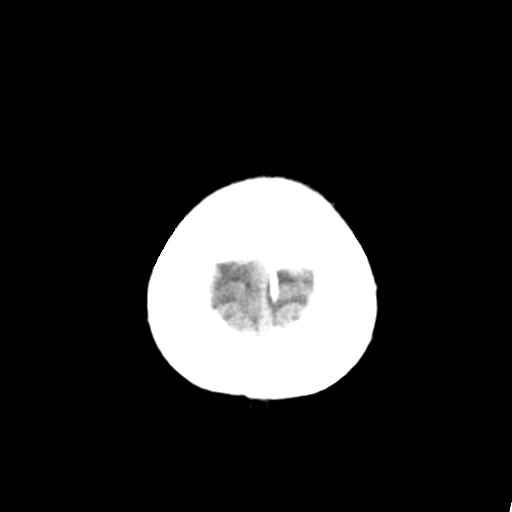

[Series 4: head bone · axial · 0.41mm/px · z∈[-98,-70]mm · 3 of 74 slices shown]
[im 8/74  bone]
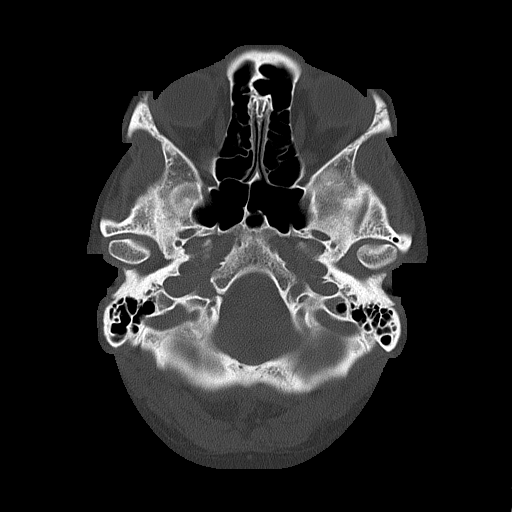
[im 15/74  bone]
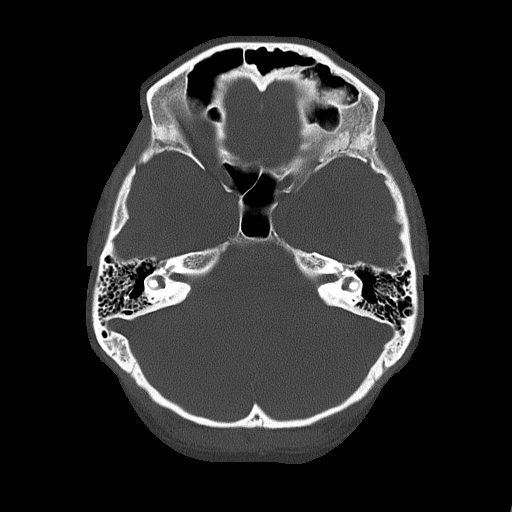
[im 22/74  bone]
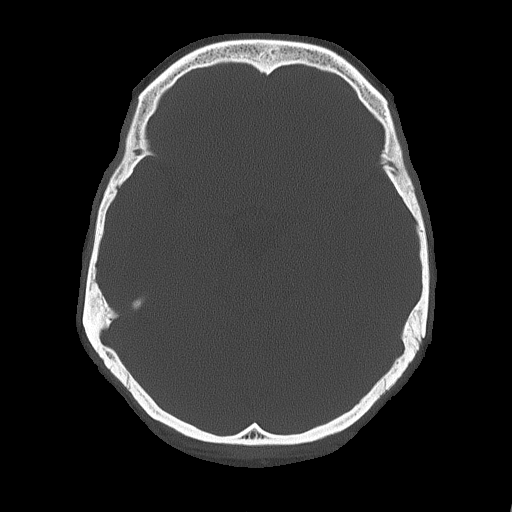

[Series 5: head without cor · coronal · non-contrast · 0.31mm/px · 3 of 60 slices shown]
[im 20/60  brain]
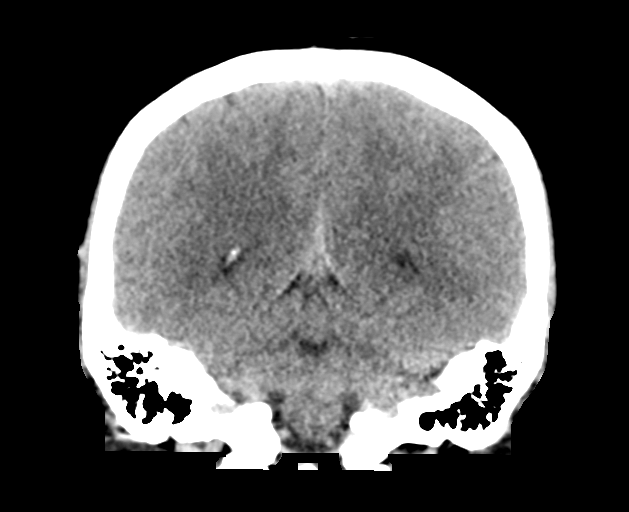
[im 27/60  brain]
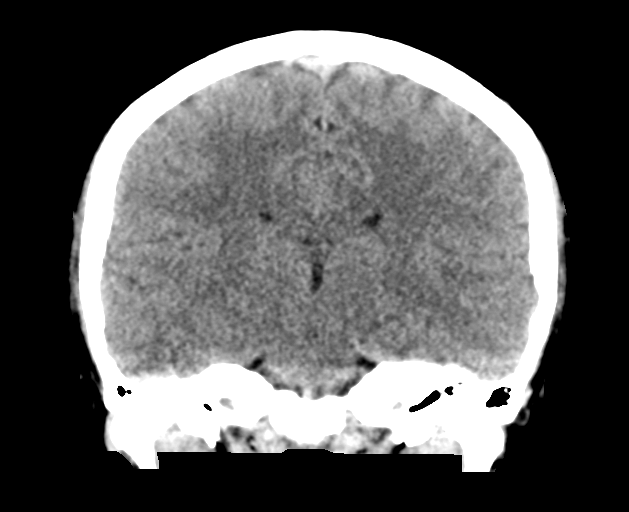
[im 33/60  brain]
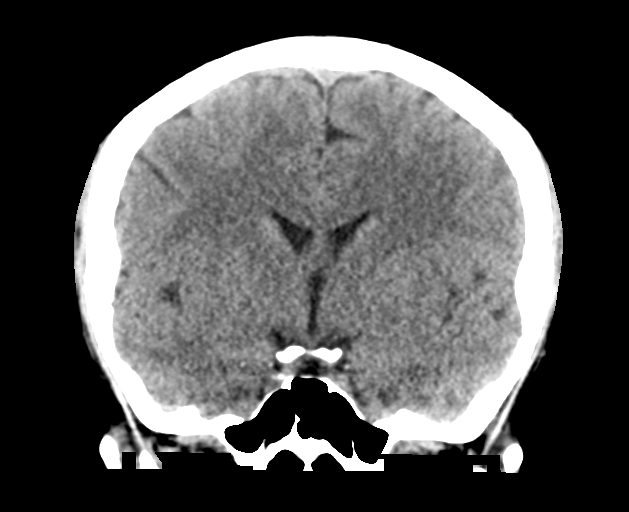

[Series 6: head without sag · sagittal · non-contrast · 0.30mm/px · 3 of 53 slices shown]
[im 18/53  brain]
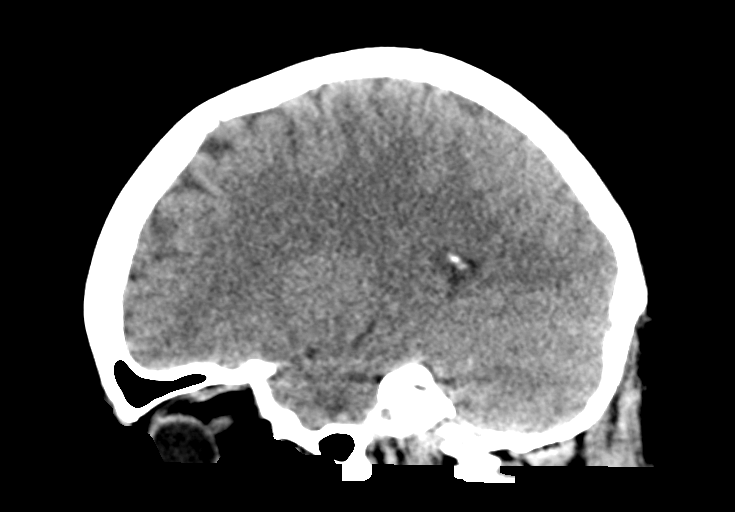
[im 27/53  brain]
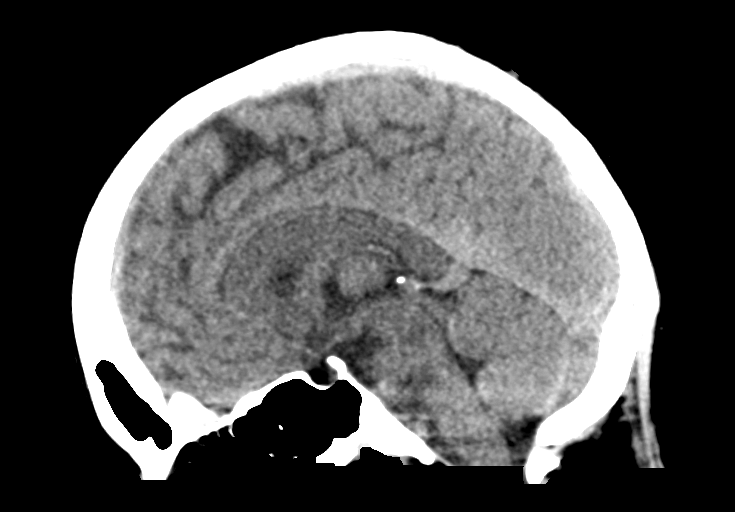
[im 35/53  brain]
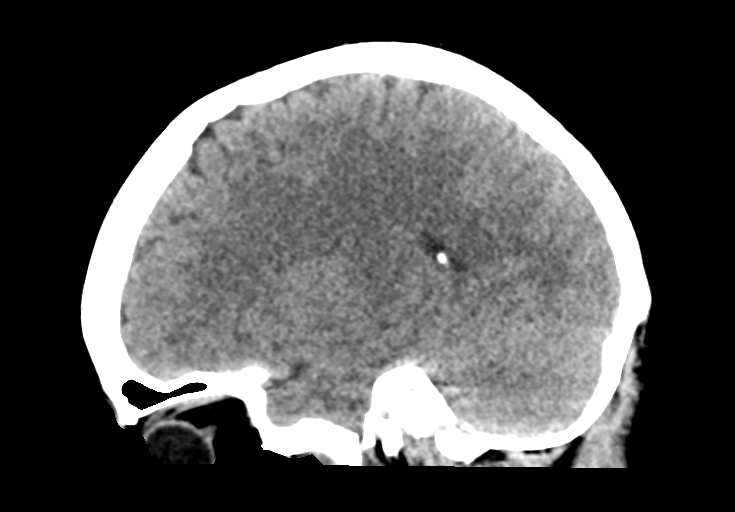

[16 of 47 positions shown; findings below may reference images not displayed]

FINDINGS: Brain: The ventricles are normal in size and configuration. No
extra-axial fluid collections are identified. The gray-white
differentiation is maintained. No CT findings for acute hemispheric
infarction or intracranial hemorrhage. No mass lesions. The
brainstem and cerebellum are normal.

Vascular: No hyperdense vessels or obvious aneurysm.

Skull: No acute skull fracture.  No bone lesion.

Sinuses/Orbits: The paranasal sinuses and mastoid air cells are
clear. The globes are intact.

Other: No scalp lesions, laceration or hematoma.
IMPRESSION: No acute intracranial findings or mass lesions.

## 2020-03-18 ENCOUNTER — Ambulatory Visit: Payer: Self-pay

## 2020-04-04 ENCOUNTER — Ambulatory Visit: Payer: Self-pay

## 2020-04-11 ENCOUNTER — Other Ambulatory Visit: Payer: Self-pay

## 2020-04-11 ENCOUNTER — Ambulatory Visit: Payer: Self-pay | Attending: Nurse Practitioner

## 2020-04-13 ENCOUNTER — Encounter: Payer: Self-pay | Admitting: Nurse Practitioner

## 2020-04-13 ENCOUNTER — Other Ambulatory Visit: Payer: Self-pay | Admitting: Nurse Practitioner

## 2020-04-13 ENCOUNTER — Ambulatory Visit: Payer: Self-pay | Attending: Nurse Practitioner | Admitting: Nurse Practitioner

## 2020-04-13 ENCOUNTER — Other Ambulatory Visit: Payer: Self-pay

## 2020-04-13 VITALS — BP 118/79 | HR 78 | Temp 98.7°F | Ht 61.0 in | Wt 147.0 lb

## 2020-04-13 DIAGNOSIS — Z1159 Encounter for screening for other viral diseases: Secondary | ICD-10-CM

## 2020-04-13 DIAGNOSIS — Z13228 Encounter for screening for other metabolic disorders: Secondary | ICD-10-CM

## 2020-04-13 DIAGNOSIS — N912 Amenorrhea, unspecified: Secondary | ICD-10-CM

## 2020-04-13 DIAGNOSIS — Z130 Encounter for screening for diseases of the blood and blood-forming organs and certain disorders involving the immune mechanism: Secondary | ICD-10-CM

## 2020-04-13 DIAGNOSIS — Z793 Long term (current) use of hormonal contraceptives: Secondary | ICD-10-CM

## 2020-04-13 DIAGNOSIS — E785 Hyperlipidemia, unspecified: Secondary | ICD-10-CM

## 2020-04-13 LAB — POCT URINE PREGNANCY: Preg Test, Ur: NEGATIVE

## 2020-04-13 MED ORDER — NORETHINDRONE 0.35 MG PO TABS: 1.0000 | ORAL_TABLET | Freq: Every day | ORAL | 11 refills | Status: DC

## 2020-04-13 MED ORDER — LEVONORGESTREL-ETHINYL ESTRAD 0.15-30 MG-MCG PO TABS: 1.0000 | ORAL_TABLET | Freq: Every day | ORAL | 11 refills | Status: DC

## 2020-04-13 NOTE — Progress Notes (Signed)
Assessment & Plan:  [name] was seen today for follow-up.  Diagnoses and all orders for this visit:  Amenorrhea due to oral contraceptive -     POCT urine pregnancy -     levonorgestrel-ethinyl estradiol (NORDETTE) 0.15-30 MG-MCG tablet; Take 1 tablet by mouth daily.  Screening for metabolic disorder -     Basic metabolic panel  Screening for deficiency anemia -     CBC  Dyslipidemia, goal LDL below 100 -     Lipid panel  Need for hepatitis C screening test -     HCV Ab w Reflex to Quant PCR  Other orders -     Discontinue: norethindrone (ORTHO MICRONOR) 0.35 MG tablet; Take 1 tablet (0.35 mg total) by mouth daily.    Patient has been counseled on age-appropriate routine health concerns for screening and prevention. These are reviewed and up-to-date. Referrals have been placed accordingly. Immunizations are up-to-date or declined.    Subjective:   Chief Complaint  Patient presents with  . Follow-up    Patient stated she is on oral birth control and she's been experience menstrual cycle symptom with breast soreness, and her have not got her period.    HPI [name] 35 y.o. female presents to office today for follow up. She has complaints of Amenorrhea after being restarted on OCPs 3 months ago. Prior to October she was receiving Depo injections which she reports caused abdominal pain so she switched back to OCPs.  UPT was negative today. She would like to try another another OCP today. [facility] and states she had no side effects with this pill. Current side effects: breast soreness.   Review of Systems  Constitutional: Negative for fever, malaise/fatigue and weight loss.  HENT: Negative.  Negative for nosebleeds.   Eyes: Negative.  Negative for blurred vision, double vision and photophobia.  Respiratory: Negative.  Negative for cough and shortness of breath.   Cardiovascular: Negative.  Negative for chest pain,  palpitations and leg swelling.  Gastrointestinal: Negative.  Negative for heartburn, nausea and vomiting.  Genitourinary:       SEE HPI  Musculoskeletal: Negative.  Negative for myalgias.  Neurological: Negative.  Negative for dizziness, focal weakness, seizures and headaches.  Psychiatric/Behavioral: Negative.  Negative for suicidal ideas.    Past Medical History:  Diagnosis Date  . Medical history non-contributory     Past Surgical History:  Procedure Laterality Date  . NO PAST SURGERIES      Family History  Problem Relation Age of Onset  . Alcohol abuse Neg Hx     Social History Reviewed with no changes to be made today.   Outpatient Medications Prior to Visit  Medication Sig Dispense Refill  . diclofenac Sodium (VOLTAREN) 1 % GEL Apply 2 g topically 4 (four) times daily. 100 g 1  . fluticasone (FLONASE) 50 MCG/ACT nasal spray Place 2 sprays into both nostrils daily. 16 g 6  . ibuprofen (ADVIL) 800 MG tablet Take 1 tablet (800 mg total) by mouth every 8 (eight) hours as needed for headache. 21 tablet 0  . Vitamin D, Ergocalciferol, (DRISDOL) 1.25 MG (50000 UNIT) CAPS capsule Take 1 capsule (50,000 Units total) by mouth every 7 (seven) days. 12 capsule 1  . Norethindrone-Ethinyl Estradiol-Fe Biphas (LO LOESTRIN FE) 1 MG-10 MCG / 10 MCG tablet Take an ACTIVE pill daily. 28 tablet 11  . cyclobenzaprine (FLEXERIL) 10 MG tablet Take 1 tablet (10 mg total) by  mouth 2 (two) times daily as needed for muscle spasms. (Patient not taking: No sig reported) 60 tablet 1   No facility-administered medications prior to visit.    No Known Allergies     Objective:    BP 118/79 (BP Location: Left Arm, Patient Position: Sitting, Cuff Size: Normal)   Pulse 78   Temp 98.7 F (37.1 C) (Oral)   Ht 5\' 1"  (1.549 m)   Wt 147 lb (66.7 kg)   SpO2 99%   BMI 27.78 kg/m  Wt Readings from Last 3 Encounters:  04/13/20 147 lb (66.7 kg)  10/09/19 144 lb (65.3 kg)  07/23/19 140 lb 9.6 oz (63.8  kg)    Physical Exam Vitals and nursing note reviewed.  Constitutional:      Appearance: She is well-developed and well-nourished.  HENT:     Head: Normocephalic and atraumatic.  Eyes:     Extraocular Movements: EOM normal.  Cardiovascular:     Rate and Rhythm: Normal rate and regular rhythm.     Pulses: Intact distal pulses.     Heart sounds: Normal heart sounds. No murmur heard. No friction rub. No gallop.   Pulmonary:     Effort: Pulmonary effort is normal. No tachypnea or respiratory distress.     Breath sounds: Normal breath sounds. No decreased breath sounds, wheezing, rhonchi or rales.  Chest:     Chest wall: No tenderness.  Abdominal:     General: Bowel sounds are normal.     Palpations: Abdomen is soft.  Musculoskeletal:        General: No edema. Normal range of motion.     Cervical back: Normal range of motion.  Skin:    General: Skin is warm and dry.  Neurological:     Mental Status: She is alert and oriented to person, place, and time.     Coordination: Coordination normal.  Psychiatric:        Mood and Affect: Mood and affect normal.        Behavior: Behavior normal. Behavior is cooperative.        Thought Content: Thought content normal.        Judgment: Judgment normal.          Patient has been counseled extensively about nutrition and exercise as well as the importance of adherence with medications and regular follow-up. The patient was given clear instructions to go to ER or return to medical center if symptoms don't improve, worsen or new problems develop. The patient verbalized understanding.   Follow-up: Return if symptoms worsen or fail to improve.   09/22/19, FNP-BC [facility] [place], [place]   04/13/2020, 10:10 AM

## 2020-04-14 LAB — BASIC METABOLIC PANEL
BUN/Creatinine Ratio: 10 (ref 9–23)
BUN: 8 mg/dL (ref 6–20)
CO2: 21 mmol/L (ref 20–29)
Calcium: 9.7 mg/dL (ref 8.7–10.2)
Chloride: 102 mmol/L (ref 96–106)
Creatinine, Ser: 0.82 mg/dL (ref 0.57–1.00)
GFR calc Af Amer: 107 mL/min/{1.73_m2} (ref >59)
GFR calc non Af Amer: 93 mL/min/{1.73_m2} (ref >59)
Glucose: 98 mg/dL (ref 65–99)
Potassium: 4.4 mmol/L (ref 3.5–5.2)
Sodium: 137 mmol/L (ref 134–144)

## 2020-04-14 LAB — LIPID PANEL
Chol/HDL Ratio: 4.6 ratio — ABNORMAL HIGH (ref 0.0–4.4)
Cholesterol, Total: 202 mg/dL — ABNORMAL HIGH (ref 100–199)
HDL: 44 mg/dL (ref >39)
LDL Chol Calc (NIH): 122 mg/dL — ABNORMAL HIGH (ref 0–99)
Triglycerides: 206 mg/dL — ABNORMAL HIGH (ref 0–149)
VLDL Cholesterol Cal: 36 mg/dL (ref 5–40)

## 2020-04-14 LAB — CBC
Hematocrit: 44.4 % (ref 34.0–46.6)
Hemoglobin: 14.2 g/dL (ref 11.1–15.9)
MCH: 28.4 pg (ref 26.6–33.0)
MCHC: 32 g/dL (ref 31.5–35.7)
MCV: 89 fL (ref 79–97)
Platelets: 316 10*3/uL (ref 150–450)
RBC: 5 x10E6/uL (ref 3.77–5.28)
RDW: 13.4 % (ref 11.7–15.4)
WBC: 10.3 10*3/uL (ref 3.4–10.8)

## 2020-04-14 LAB — HCV INTERPRETATION

## 2020-04-14 LAB — HCV AB W REFLEX TO QUANT PCR: HCV Ab: 0.1 s/co ratio (ref 0.0–0.9)

## 2020-06-29 ENCOUNTER — Other Ambulatory Visit: Payer: Self-pay

## 2020-06-29 MED FILL — Levonorgestrel & Ethinyl Estradiol Tab 0.15 MG-30 MCG: ORAL | 28 days supply | Qty: 28 | Fill #0 | Status: AC

## 2020-06-30 ENCOUNTER — Other Ambulatory Visit: Payer: Self-pay

## 2020-07-05 ENCOUNTER — Ambulatory Visit: Payer: Self-pay | Attending: Family Medicine | Admitting: Family Medicine

## 2020-07-05 ENCOUNTER — Encounter: Payer: Self-pay | Admitting: Family Medicine

## 2020-07-05 ENCOUNTER — Other Ambulatory Visit: Payer: Self-pay

## 2020-07-05 VITALS — BP 103/72 | HR 92 | Ht 61.0 in | Wt 145.8 lb

## 2020-07-05 DIAGNOSIS — M542 Cervicalgia: Secondary | ICD-10-CM

## 2020-07-05 DIAGNOSIS — Z3041 Encounter for surveillance of contraceptive pills: Secondary | ICD-10-CM

## 2020-07-05 MED ORDER — CYCLOBENZAPRINE HCL 10 MG PO TABS
10.0000 mg | ORAL_TABLET | Freq: Two times a day (BID) | ORAL | 1 refills | Status: DC | PRN
  Filled 2020-07-05: qty 60, 30d supply, fill #0

## 2020-07-05 NOTE — Progress Notes (Signed)
Has been having pain in back of neck down back for 3 days. Wants to change back to old birth control medication.

## 2020-07-05 NOTE — Patient Instructions (Signed)
Esguince cervical Cervical Sprain Un esguince cervical es una distensin o un desgarro de uno o ms de los ligamentos del cuello. Los ligamentos son los tejidos que conectan los huesos. Un esguince cervical puede ser desde muy leve a muy grave. En los casos graves, el esguince cervical puede hacer que los huesos de la columna (vrtebras) en el cuello se vuelvan inestables. Esto puede dar lugar a dao en la mdula espinal y problemas graves del sistema nervioso. El tiempo que demora la curacin de un esguince cervical depende de la causa y de la extensin de la lesin. La mayora de las veces se curan en 4 a 6 semanas. Cules son las causas? Los esguinces cervicales pueden producirse por un traumatismo como, por ejemplo, una lesin recibida en un accidente automovilstico, una cada o un movimiento brusco de adelante hacia atrs de la cabeza y el cuello (latigazo cervical). Los esguinces cervicales leves pueden deberse al desgaste que se produce con el paso del tiempo. Qu incrementa el riesgo? Los siguientes factores pueden hacer que sea ms propenso a contraer esta afeccin:  Realizar actividades que conllevan un alto riesgo de sufrir un traumatismo en el cuello. Estos incluyen los deportes de contacto, las carreras de automviles, la gimnasia y el buceo.  Asumir riesgos al conducir o montar en un vehculo motorizado.  Artrosis de columna.  Poca fuerza y flexibilidad en el cuello.  Tener una lesin previa en el cuello.  Mala postura.  Estar durante perodos de tiempo prolongados en ciertas posiciones que estresan el cuello, como sentarse delante de la computadora durante un largo tiempo. Cules son los signos o sntomas? Los sntomas de esta afeccin incluyen:  Dolor, inflamacin, entumecimiento, dolor a la palpacin, hinchazn o una sensacin de ardor en el frente, la parte posterior y los laterales del cuello, los hombros o la parte superior de la espalda.  Rigidez repentina de los  msculos del cuello (espasmos).  Capacidad limitada para mover el cuello.  Dolor de cabeza.  Mareos.  Nuseas o vmitos.  Debilidad, adormecimiento u hormigueo en la mano o el brazo. Los sntomas pueden manifestarse inmediatamente despus de la lesin o en el transcurso de algunos das. En algunos casos, los sntomas pueden desaparecer con tratamiento y volver a aparecer (ser recurrentes) ms adelante. Cmo se diagnostica? Esta afeccin se puede diagnosticar en funcin de lo siguiente:  Sus antecedentes mdicos.  Sus sntomas.  Cualquier lesin reciente o problemas conocidos en el cuello que tenga, como artritis en el cuello.  Un examen fsico.  Estudios de diagnstico por imgenes, como radiografas, resonancia magntica (RM) y exploracin por tomografa computarizada (TC). Cmo se trata? Esta afeccin se trata con reposo y la aplicacin de hielo en la zona lesionada, y con ejercicios de fisioterapia. La terapia con calor puede usarse de 2 a 3 das despus de la lesin si no hay hinchazn. El tratamiento vara en funcin de la gravedad de la afeccin y puede incluir lo siguiente:  Mantener el cuello fijo (inmovilizado) durante perodos de tiempo. Esto se puede hacer mediante lo siguiente: ? Un collarn cervical. Este sostiene el mentn y la parte posterior del cuello. ? Un dispositivo de traccin cervical. Se trata de un cabestrillo que sostiene el cuello. Este dispositivo quita peso y presin del cuello, y puede ayudar a aliviar el dolor.  Medicamentos que ayudan a aliviar el dolor y la inflamacin.  Medicamentos que ayudan relajar los msculos (relajantes musculares).  Ciruga. Esto es poco frecuente. Siga estas instrucciones en su casa:   Medicamentos  Use los medicamentos de venta libre y los recetados solamente como se lo haya indicado el mdico.  Pregntele al mdico si el medicamento recetado: ? Hace necesario que evite conducir o usar maquinaria pesada. ? Puede  causarle estreimiento. Es posible que tenga que tomar estas medidas para prevenir o tratar el estreimiento:  Beber suficiente lquido como para mantener la orina de color amarillo plido.  Usar medicamentos recetados o de venta libre.  Consumir alimentos ricos en fibra, como frijoles, cereales integrales, y frutas y verduras frescas.  Limitar el consumo de alimentos ricos en grasa y azcares procesados, como los alimentos fritos o dulces.   Si tiene un collarn cervical:  Use el collarn como se lo haya indicado el mdico. No se lo quite a menos que se lo indiquen.  Consulte antes de hacerle ajustes al collarn.  Si tiene el pelo largo, mantngalo fuera del collarn.  Pregntele al mdico si puede quitarse el collarn para baarse e higienizarse. En este caso: ? Siga las instrucciones sobre cmo retirarlo de manera segura. ? Lvelo a mano con agua y jabn suave, y squelo al aire por completo. ? Si el collarn tiene almohadillas desmontables, qutelas cada 1 o 2das y lvelas a mano con agua y jabn. Djelas que se sequen por completo antes de volver a ponerlas en el collarn.  Informe al mdico si tiene irritacin o llagas en la piel debajo del collarn. Control del dolor, la rigidez y la hinchazn  Si se lo indican, use un dispositivo de traccin cervical como se lo hayan explicado.  Si se lo indican, aplique hielo sobre la zona afectada. Para hacer esto: ? Ponga el hielo en una bolsa plstica. ? Coloque una toalla entre la piel y la bolsa. ? Aplique el hielo durante 20minutos, 2 a 3veces por da.  Si se lo indican, aplique calor en la zona afectada antes de hacer los ejercicios de fisioterapia o con la frecuencia que le haya indicado el mdico. Use la fuente de calor que el mdico le recomiende, como una compresa de calor hmedo o una almohadilla trmica. ? Coloque una toalla entre la piel y la fuente de calor. ? Aplique calor durante 20 a 30minutos. ? Retire la fuente de  calor si la piel se pone de color rojo brillante. Esto es especialmente importante si no puede sentir dolor, calor o fro. Puede correr un riesgo mayor de sufrir quemaduras.      Actividad  No conduzca mientras usa un collarn cervical. Si no tiene un collarn cervical, pregunte si es seguro que conduzca durante el proceso de curacin del cuello.  No levante ningn objeto que pese ms de10 libras (4.5kg) o que supere el lmite de peso que le hayan indicado, hasta que el mdico le diga que puede hacerlo.  Haga reposo como se lo haya indicado el mdico.  Si le indican fisioterapia, haga los ejercicios como se lo haya indicado el mdico o el fisioterapeuta.  Retome sus actividades normales segn lo indicado por el mdico. Evite las posiciones y actividades que empeoran los sntomas. Pregntele al mdico qu actividades son seguras para usted. Instrucciones generales  No consuma ningn producto que contenga nicotina o tabaco, como cigarrillos, cigarrillos electrnicos y tabaco de mascar. Estos pueden retrasar la recuperacin. Si necesita ayuda para dejar de fumar, consulte al mdico.  Concurra a todas las visitas de seguimiento como se lo haya indicado el mdico o fisioterapeuta. Esto es importante. Cmo se previene? Para evitar que se produzca   nuevamente un esguince cervical:  Mantenga una buena postura. Haga los ajustes necesarios en su lugar de trabajo para lograr esto.  Haga ejercicio regularmente como se lo haya indicado el fisioterapeuta o su mdico.  Evite realizar actividades de riesgo que puedan causar un esguince cervical. Comunquese con un mdico si tiene:  Sntomas que empeoran o no mejoran despus de 2semanas de tratamiento.  Un dolor que empeora o que no mejora con los medicamentos.  Sntomas nuevos e inexplicables.  Llagas o la piel irritada en el cuello por el uso del collarn cervical. Solicite ayuda de inmediato si:  Siente dolor intenso.  Siente  adormecimiento, hormigueo o debilidad en cualquier parte del cuerpo.  No puede mover una parte del cuerpo (tiene parlisis).  Siente dolor en el cuello junto con mareos o dolor de cabeza intensos. Resumen  Un esguince cervical es una distensin o un desgarro de uno o ms de los ligamentos del cuello.  Los esguinces cervicales pueden producirse por un traumatismo como, por ejemplo, una lesin recibida en un accidente automovilstico, una cada o un movimiento brusco de adelante hacia atrs de la cabeza y el cuello (latigazo cervical).  Los sntomas pueden manifestarse inmediatamente despus de la lesin o en el transcurso de algunos das.  Esta afeccin se puede tratar con reposo, hielo, calor, medicamentos, fisioterapia y ciruga. Esta informacin no tiene como fin reemplazar el consejo del mdico. Asegrese de hacerle al mdico cualquier pregunta que tenga. Document Revised: 02/02/2019 Document Reviewed: 02/02/2019 Elsevier Patient Education  2021 [employer]vier Inc.  

## 2020-07-05 NOTE — Progress Notes (Addendum)
Subjective:  Patient ID: [name], female    DOB: [birth year]  Age: 35 y.o. MRN: [id number]  CC: Neck Pain   HPI [name] is a 35 year old female patient of Bertram Denver, FNP who presents today for an acute visit.   She has had a 3 day history of posterior neck pain radiating down neck and is worse at night. Rated as 8/10. She had no radiation to arms, numbness in arms. Aleve does provide some relief. She has had similar pain in the past. Driving makes the pain worse. She endorses being stressed. Would like to switch to previous OCP as she complains her current OCP causes breast tenderness.  She has a picture of her previous OCP on her phone which I have looked at and is exactly the same as the one she recently received from the pharmacy.   Past Medical History:  Diagnosis Date  . Medical history non-contributory     Past Surgical History:  Procedure Laterality Date  . NO PAST SURGERIES      Family History  Problem Relation Age of Onset  . Alcohol abuse Neg Hx     No Known Allergies  Outpatient Medications Prior to Visit  Medication Sig Dispense Refill  . levonorgestrel-ethinyl estradiol (NORDETTE) 0.15-30 MG-MCG tablet TAKE 1 TABLET BY MOUTH DAILY. 28 tablet 11  . cyclobenzaprine (FLEXERIL) 10 MG tablet Take 1 tablet (10 mg total) by mouth 2 (two) times daily as needed for muscle spasms. (Patient not taking: No sig reported) 60 tablet 1  . diclofenac Sodium (VOLTAREN) 1 % GEL Apply 2 g topically 4 (four) times daily. (Patient not taking: Reported on 07/05/2020) 100 g 1  . fluticasone (FLONASE) 50 MCG/ACT nasal spray Place 2 sprays into both nostrils daily. (Patient not taking: Reported on 07/05/2020) 16 g 6  . ibuprofen (ADVIL) 800 MG tablet Take 1 tablet (800 mg total) by mouth every 8 (eight) hours as needed for headache. (Patient not taking: Reported on 07/05/2020) 21 tablet 0  . Norethindrone-Ethinyl Estradiol-Fe Biphas (LO LOESTRIN FE) 1 MG-10  MCG / 10 MCG tablet TAKE AN ACTIVE PILL DAILY. (Patient not taking: Reported on 07/05/2020) 28 tablet 11  . Vitamin D, Ergocalciferol, (DRISDOL) 1.25 MG (50000 UNIT) CAPS capsule Take 1 capsule (50,000 Units total) by mouth every 7 (seven) days. (Patient not taking: Reported on 07/05/2020) 12 capsule 1  . levonorgestrel-ethinyl estradiol (NORDETTE) 0.15-30 MG-MCG tablet Take 1 tablet by mouth daily. 28 tablet 11   No facility-administered medications prior to visit.     ROS Review of Systems  Constitutional: Negative for activity change, appetite change and fatigue.  HENT: Negative for congestion, sinus pressure and sore throat.   Eyes: Negative for visual disturbance.  Respiratory: Negative for cough, chest tightness, shortness of breath and wheezing.   Cardiovascular: Negative for chest pain and palpitations.  Gastrointestinal: Negative for abdominal distention, abdominal pain and constipation.  Endocrine: Negative for polydipsia.  Genitourinary: Negative for dysuria and frequency.  Musculoskeletal: Positive for neck pain. Negative for arthralgias and back pain.  Skin: Negative for rash.  Neurological: Negative for tremors, light-headedness and numbness.  Hematological: Does not bruise/bleed easily.  Psychiatric/Behavioral: Negative for agitation and behavioral problems.    Objective:  BP 103/72   Pulse 92   Ht 5\' 1"  (1.549 m)   Wt 145 lb 12.8 oz (66.1 kg)   SpO2 99%   BMI 27.55 kg/m   BP/Weight 07/05/2020 04/13/2020 10/09/2019  Systolic BP 103 118 -  Diastolic  BP 72 79 -  Wt. (Lbs) 145.8 147 144  BMI 27.55 27.78 27.21      Physical Exam Constitutional:      Appearance: She is well-developed.  Neck:     Vascular: No JVD.  Cardiovascular:     Rate and Rhythm: Normal rate.     Heart sounds: Normal heart sounds. No murmur heard.   Pulmonary:     Effort: Pulmonary effort is normal.     Breath sounds: Normal breath sounds. No wheezing or rales.  Chest:     Chest wall:  No tenderness.  Abdominal:     General: Bowel sounds are normal. There is no distension.     Palpations: Abdomen is soft. There is no mass.     Tenderness: There is no abdominal tenderness.  Musculoskeletal:        General: Normal range of motion.     Cervical back: Tenderness (TTP of upper neck extending along cervical spine) present. No rigidity.     Right lower leg: No edema.     Left lower leg: No edema.  Neurological:     Mental Status: She is alert and oriented to person, place, and time.  Psychiatric:        Mood and Affect: Mood normal.     CMP Latest Ref Rng & Units 04/13/2020 06/08/2019 02/04/2019  Glucose 65 - 99 mg/dL 98 91 88  BUN 6 - 20 mg/dL 8 8 9   Creatinine 0.57 - 1.00 mg/dL 3.97 6.73  Sodium 134 - 144 mmol/L 137 140 139  Potassium 3.5 - 5.2 mmol/L 4.4 4.0 3.9  Chloride 96 - 106 mmol/L 102 100 99  CO2 20 - 29 mmol/L 21 23 23   Calcium 8.7 - 10.2 mg/dL 9.7 9.6 9.2  Total Protein 6.0 - 8.5 g/dL - 7.8 -  Total Bilirubin 0.0 - 1.2 mg/dL - 4.19 -  Alkaline Phos 39 - 117 IU/L - 68 -  AST 0 - 40 IU/L - 25 -  ALT 0 - 32 IU/L - 33(H) -    Lipid Panel     Component Value Date/Time   CHOL 202 (H) 04/13/2020 1030   TRIG 206 (H) 04/13/2020 1030   HDL 44 04/13/2020 1030   CHOLHDL 4.6 (H) 04/13/2020 1030   LDLCALC 122 (H) 04/13/2020 1030    CBC    Component Value Date/Time   WBC 10.3 04/13/2020 1030   WBC 7.8 01/17/2019 1237   RBC 5.00 04/13/2020 1030   RBC 5.23 (H) 01/17/2019 1237   HGB 14.2 04/13/2020 1030   HCT 44.4 04/13/2020 1030   PLT 316 04/13/2020 1030   MCV 89 04/13/2020 1030   MCH 28.4 04/13/2020 1030   MCH 29.6 01/17/2019 1237   MCHC 32.0 04/13/2020 1030   MCHC 33.3 01/17/2019 1237   RDW 13.4 04/13/2020 1030   LYMPHSABS 3.2 (H) 02/04/2019 1530   MONOABS 0.4 01/17/2019 1237   EOSABS 0.2 02/04/2019 1530   BASOSABS 0.0 02/04/2019 1530    Lab Results  Component Value Date   HGBA1C 5.4 06/08/2019    Assessment & Plan:  1. Oral  contraceptive pill surveillance Advised that breast tenderness is an adverse effect of oral contraceptive pills I have given her the option of switching to a different oral contraceptive pill altogether but she would like to stay with her current pill which is the same as the one she previously requested.  2. Neck pain Likely musculoskeletal Will place on muscle relaxant Advised to use  heat - cyclobenzaprine (FLEXERIL) 10 MG tablet; Take 1 tablet (10 mg total) by mouth 2 (two) times daily as needed for muscle spasms.  Dispense: 60 tablet; Refill: 1      Hoy Register, MD, FAAFP. [facility] [place], [place] [phone]   07/05/2020, 4:04 PM

## 2020-08-01 ENCOUNTER — Other Ambulatory Visit: Payer: Self-pay

## 2020-08-01 MED FILL — Levonorgestrel & Ethinyl Estradiol Tab 0.15 MG-30 MCG: ORAL | 56 days supply | Qty: 56 | Fill #1 | Status: AC

## 2020-08-03 ENCOUNTER — Other Ambulatory Visit: Payer: Self-pay

## 2020-09-21 ENCOUNTER — Other Ambulatory Visit: Payer: Self-pay

## 2020-09-21 MED FILL — Norethin-Eth Estradiol-Fe Tab 1 MG-10 MCG (24)/10 MCG (2): ORAL | 56 days supply | Qty: 56 | Fill #0 | Status: AC

## 2020-09-26 ENCOUNTER — Other Ambulatory Visit: Payer: Self-pay

## 2020-11-30 ENCOUNTER — Other Ambulatory Visit: Payer: Self-pay

## 2020-11-30 MED FILL — Levonorgestrel & Ethinyl Estradiol Tab 0.15 MG-30 MCG: ORAL | 56 days supply | Qty: 56 | Fill #2 | Status: AC

## 2021-02-08 ENCOUNTER — Other Ambulatory Visit: Payer: Self-pay

## 2021-02-08 MED FILL — Levonorgestrel & Ethinyl Estradiol Tab 0.15 MG-30 MCG: ORAL | 56 days supply | Qty: 56 | Fill #3 | Status: AC

## 2021-02-24 ENCOUNTER — Ambulatory Visit: Payer: Self-pay | Admitting: *Deleted

## 2021-02-24 NOTE — Telephone Encounter (Signed)
  Chief Complaint: Sore throat, difficulty swallowing, pain all the way around her neck and feeling hot. Symptoms: sore throat, difficulty swallowing Frequency: constant Pertinent Negatives: Patient denies congestion, runny nose, diarrhea, vomiting, sick exposures, body aches or chills but does think she may have a fever. Disposition: [] ED /[x] Urgent Care (no appt availability in office) / [] Appointment(In office/virtual)/ []   Virtual Care/ [] Home Care/ [] Refused Recommended Disposition  Additional Notes: No appts at [facility] so referred to the urgent care which she was agreeable to doing.   Going to [facility].

## 2021-02-24 NOTE — Telephone Encounter (Signed)
Reason for Disposition  [1] SEVERE neck pain (e.g., excruciating, unable to do any normal activities) AND [2] not improved after 2 hours of pain medicine    Difficulty with swallowing, sore throat.  Pain in neck all the way around.  Answer Assessment - Initial Assessment Questions 1. ONSET: "When did the pain begin?"      Pt calling in c/o pain.  Yesterday woke up really hot.   My neck is hurting.   I can swallow but it's painful.  My jaw is painful on both sides too.    I don't have a cold or congestion.   I'm taking Advil for the pain.   I think I do have fever.   No body aches or chills.    Yesterday the pain started in the morning.  It's hard to eat because it's hard to swallow.   Still has tonsils. 2. LOCATION: "Where does it hurt?"      My jaw and neck is hurting it goes around my neck.   It's hard to swallow.  Throat is sore.   I feel hot.   It's painful when I swallow.    3. PATTERN "Does the pain come and go, or has it been constant since it started?"      Constant pain.   4. SEVERITY: "How bad is the pain?"  (Scale 1-10; or mild, moderate, severe)   - NO PAIN (0): no pain or only slight stiffness    - MILD (1-3): doesn't interfere with normal activities    - MODERATE (4-7): interferes with normal activities or awakens from sleep    - SEVERE (8-10):  excruciating pain, unable to do any normal activities      9-10/10   The pain is bad. 5. RADIATION: "Does the pain go anywhere else, shoot into your arms?"     Feeling pain in jaws and neck too all the way around.   6. CORD SYMPTOMS: "Any weakness or numbness of the arms or legs?"     Not asked 7. CAUSE: "What do you think is causing the neck pain?"     I don't know.   Last week my ears were painful but better this week.  The pain comes and goes.   No runny nose.   No coughing.  No diarrhea or vomiting. 8. NECK OVERUSE: "Any recent activities that involved turning or twisting the neck?"     No injuries or accidents. 9. OTHER SYMPTOMS:  "Do you have any other symptoms?" (e.g., headache, fever, chest pain, difficulty breathing, neck swelling)     Having headache on right side, no visual changes.   2 days ago I felt like my chest was painful.   Then yesterday I woke up with these throat symptoms.    10. PREGNANCY: "Is there any chance you are pregnant?" "When was your last menstrual period?"       No  Protocols used: Neck Pain or Stiffness-A-AH

## 2021-03-30 ENCOUNTER — Ambulatory Visit: Payer: Self-pay | Admitting: *Deleted

## 2021-03-30 NOTE — Telephone Encounter (Signed)
°  Chief Complaint: chest pain Symptoms: left sided chest pain, comes/goes, lasts 1 hour, has now- pain severe Frequency: 4 months Pertinent Negatives: Patient denies SOB Disposition: [x] ED /[] Urgent Care (no appt availability in office) / [] Appointment(In office/virtual)/ []  [facility]/ [] Home Care/ [] Refused Recommended Disposition /[] [facility]/ []  Follow-up with PCP Additional Notes:

## 2021-03-30 NOTE — Telephone Encounter (Signed)
Reason for Disposition  SEVERE chest pain  Answer Assessment - Initial Assessment Questions 1. LOCATION: "Where does it hurt?"       Left side 2. RADIATION: "Does the pain go anywhere else?" (e.g., into neck, jaw, arms, back)     Sometimes back 3. ONSET: "When did the chest pain begin?" (Minutes, hours or days)      4 month ago- stress makes it worse 4. PATTERN "Does the pain come and go, or has it been constant since it started?"  "Does it get worse with exertion?"      Comes and goes, yes 5. DURATION: "How long does it last" (e.g., seconds, minutes, hours)     1 hour 6. SEVERITY: "How bad is the pain?"  (e.g., Scale 1-10; mild, moderate, or severe)    - MILD (1-3): doesn't interfere with normal activities     - MODERATE (4-7): interferes with normal activities or awakens from sleep    - SEVERE (8-10): excruciating pain, unable to do any normal activities       10- pain now 7. CARDIAC RISK FACTORS: "Do you have any history of heart problems or risk factors for heart disease?" (e.g., angina, prior heart attack; diabetes, high blood pressure, high cholesterol, smoker, or strong family history of heart disease)     *No Answer* 8. PULMONARY RISK FACTORS: "Do you have any history of lung disease?"  (e.g., blood clots in lung, asthma, emphysema, birth control pills)     *No Answer* 9. CAUSE: "What do you think is causing the chest pain?"     *No Answer* 10. OTHER SYMPTOMS: "Do you have any other symptoms?" (e.g., dizziness, nausea, vomiting, sweating, fever, difficulty breathing, cough)       *No Answer* 11. PREGNANCY: "Is there any chance you are pregnant?" "When was your last menstrual period?"       *No Answer*  Protocols used: Chest Pain-A-AH

## 2021-04-05 ENCOUNTER — Other Ambulatory Visit: Payer: Self-pay

## 2021-04-05 MED FILL — Levonorgestrel & Ethinyl Estradiol Tab 0.15 MG-30 MCG: ORAL | 56 days supply | Qty: 56 | Fill #0 | Status: AC

## 2021-04-07 ENCOUNTER — Other Ambulatory Visit: Payer: Self-pay

## 2021-04-21 ENCOUNTER — Ambulatory Visit: Payer: Self-pay

## 2021-06-09 ENCOUNTER — Other Ambulatory Visit: Payer: Self-pay

## 2021-06-09 ENCOUNTER — Ambulatory Visit: Payer: Self-pay | Attending: Nurse Practitioner | Admitting: Nurse Practitioner

## 2021-06-09 ENCOUNTER — Other Ambulatory Visit (HOSPITAL_COMMUNITY)
Admission: RE | Admit: 2021-06-09 | Discharge: 2021-06-09 | Disposition: A | Discharge status: Home / Self Care | Payer: Self-pay | Source: Ambulatory Visit | Attending: Nurse Practitioner | Admitting: Nurse Practitioner

## 2021-06-09 ENCOUNTER — Encounter: Payer: Self-pay | Admitting: Nurse Practitioner

## 2021-06-09 VITALS — BP 116/78 | HR 74 | Wt 144.6 lb

## 2021-06-09 DIAGNOSIS — N760 Acute vaginitis: Secondary | ICD-10-CM

## 2021-06-09 DIAGNOSIS — Z114 Encounter for screening for human immunodeficiency virus [HIV]: Secondary | ICD-10-CM

## 2021-06-09 DIAGNOSIS — Z862 Personal history of diseases of the blood and blood-forming organs and certain disorders involving the immune mechanism: Secondary | ICD-10-CM

## 2021-06-09 DIAGNOSIS — Z0000 Encounter for general adult medical examination without abnormal findings: Secondary | ICD-10-CM

## 2021-06-09 DIAGNOSIS — Z3041 Encounter for surveillance of contraceptive pills: Secondary | ICD-10-CM

## 2021-06-09 DIAGNOSIS — E785 Hyperlipidemia, unspecified: Secondary | ICD-10-CM

## 2021-06-09 DIAGNOSIS — Z23 Encounter for immunization: Secondary | ICD-10-CM

## 2021-06-09 DIAGNOSIS — R109 Unspecified abdominal pain: Secondary | ICD-10-CM

## 2021-06-09 DIAGNOSIS — E559 Vitamin D deficiency, unspecified: Secondary | ICD-10-CM

## 2021-06-09 MED ORDER — CYCLOBENZAPRINE HCL 10 MG PO TABS
10.0000 mg | ORAL_TABLET | Freq: Two times a day (BID) | ORAL | 1 refills | Status: DC | PRN
  Filled 2021-06-09: qty 60, 30d supply, fill #0
  Filled 2021-12-21: qty 60, 30d supply, fill #1

## 2021-06-09 MED ORDER — LEVONORGESTREL-ETHINYL ESTRAD 0.15-30 MG-MCG PO TABS
1.0000 | ORAL_TABLET | Freq: Every day | ORAL | 11 refills | Status: DC
  Filled 2021-06-09: qty 28, 28d supply, fill #0
  Filled 2021-07-10: qty 84, 84d supply, fill #1
  Filled 2021-10-02: qty 84, 84d supply, fill #2
  Filled 2021-12-21: qty 84, 84d supply, fill #3
  Filled 2022-03-13 – 2022-03-21 (×2): qty 28, 28d supply, fill #4
  Filled 2022-04-10: qty 28, 28d supply, fill #5

## 2021-06-09 MED ORDER — VITAMIN D (ERGOCALCIFEROL) 1.25 MG (50000 UNIT) PO CAPS
50000.0000 [IU] | ORAL_CAPSULE | ORAL | 1 refills | Status: DC
  Filled 2021-06-09: qty 12, 84d supply, fill #0
  Filled 2021-08-27: qty 12, 84d supply, fill #1

## 2021-06-09 NOTE — Progress Notes (Signed)
? ?Assessment & Plan:  ?[name] was seen today for annual exam. ? ?Diagnoses and all orders for this visit: ? ?Encounter for annual physical exam ?-     CMP14+EGFR ?-     CBC ? ?Need for immunization against influenza ?-     Flu Vaccine QUAD 43moIM (Fluarix, Fluzone & Alfiuria Quad PF) ? ?Right flank pain ?-     Urinalysis, Complete ?-     cyclobenzaprine (FLEXERIL) 10 MG tablet; Take 1 tablet (10 mg total) by mouth 2 (two) times daily as needed for muscle spasms. ?-     Cervicovaginal ancillary only ? ?Acute vaginitis ?-     Cervicovaginal ancillary only ? ?Dyslipidemia, goal LDL below 100 ?-     Lipid panel ? ?Vitamin D deficiency disease ?-     Vitamin D, Ergocalciferol, (DRISDOL) 1.25 MG (50000 UNIT) CAPS capsule; Take 1 capsule (50,000 Units total) by mouth every 7 (seven) days. ?-     VITAMIN D 25 Hydroxy (Vit-D Deficiency, Fractures) ? ?Encounter for screening for HIV ?-     HIV antibody (with reflex) ? ?History of anemia ?-     CBC ? ?Encounter for birth control pills maintenance ?-     levonorgestrel-ethinyl estradiol (NORDETTE) 0.15-30 MG-MCG tablet; Take 1 tablet by mouth daily. ? ? ? ?Patient has been counseled on age-appropriate routine health concerns for screening and prevention. These are reviewed and up-to-date. Referrals have been placed accordingly. Immunizations are up-to-date or declined.    ?Subjective:  ? ?Chief Complaint  ?Patient presents with  ? Annual Exam  ? ?HPI ?Toni Villalta [place]89y.o. female presents to office today for annual physical exam ?PMH is non-contributory.  ? ? ?Carpal Tunnel Syndrome: Patient presents for presents evaluation of possible carpal tunnel syndrome.  Onset of the symptoms was several years ago. Current symptoms include  pain, swelling, numbness and tingling in the right hand . Inciting event/aggravating factors: repetitive activity: cooking, cleaning, preparing food with utensils.  Patient's course of sKZ:SWFUXNAThave progressed to a point and plateaued.  Evaluation to date: none.  Treatment to date: has avoided aggravating activity for several minutes, which has been somewhat effective.  She also notes independence being given hand splints after the birth of one of her children due to possible carpal tunnel syndrome ? ?Reports pain in the bilateral elbows when pressure is applied such as resting her arms on hard surfaces.  Pain immediately resolves when there is no pressure on the elbows. ? ? ?Urinary Tract symptoms ?She notes right flank pain and some discomfort with urination.  Associated symptoms include increased vaginal discharge with no odor.  She denies dysuria, hematuria, urgency, frequency, hesitancy or feelings of incomplete bladder emptying.  She does not have a history of recurring UTI or pyelonephritis ? ? ?Review of Systems  ?Constitutional:  Negative for fever, malaise/fatigue and weight loss.  ?HENT: Negative.  Negative for nosebleeds.   ?Eyes: Negative.  Negative for blurred vision, double vision and photophobia.  ?Respiratory: Negative.  Negative for cough and shortness of breath.   ?Cardiovascular: Negative.  Negative for chest pain, palpitations and leg swelling.  ?Gastrointestinal: Negative.  Negative for heartburn, nausea and vomiting.  ?Genitourinary:  Positive for flank pain. Negative for dysuria, frequency, hematuria and urgency.  ?Musculoskeletal:  Positive for joint pain and myalgias.  ?Skin: Negative.   ?Neurological: Negative.  Negative for dizziness, focal weakness, seizures and headaches.  ?Endo/Heme/Allergies: Negative.   ?Psychiatric/Behavioral: Negative.  Negative for suicidal ideas.   ? ?  Past Medical History:  ?Diagnosis Date  ? Medical history non-contributory   ? ? ?Past Surgical History:  ?Procedure Laterality Date  ? NO PAST SURGERIES    ? ? ?Family History  ?Problem Relation Age of Onset  ? Alcohol abuse Neg Hx   ? ? ?Social History Reviewed with no changes to be made today.  ? ?Outpatient Medications Prior to Visit   ?Medication Sig Dispense Refill  ? cyclobenzaprine (FLEXERIL) 10 MG tablet Take 1 tablet (10 mg total) by mouth 2 (two) times daily as needed for muscle spasms. 60 tablet 1  ? diclofenac Sodium (VOLTAREN) 1 % GEL Apply 2 g topically 4 (four) times daily. (Patient not taking: Reported on 07/05/2020) 100 g 1  ? fluticasone (FLONASE) 50 MCG/ACT nasal spray Place 2 sprays into both nostrils daily. (Patient not taking: Reported on 07/05/2020) 16 g 6  ? ibuprofen (ADVIL) 800 MG tablet Take 1 tablet (800 mg total) by mouth every 8 (eight) hours as needed for headache. (Patient not taking: Reported on 07/05/2020) 21 tablet 0  ? levonorgestrel-ethinyl estradiol (NORDETTE) 0.15-30 MG-MCG tablet TAKE 1 TABLET BY MOUTH DAILY. 28 tablet 11  ? Norethindrone-Ethinyl Estradiol-Fe Biphas (LO LOESTRIN FE) 1 MG-10 MCG / 10 MCG tablet TAKE AN ACTIVE PILL DAILY. (Patient not taking: Reported on 07/05/2020) 28 tablet 11  ? Vitamin D, Ergocalciferol, (DRISDOL) 1.25 MG (50000 UNIT) CAPS capsule Take 1 capsule (50,000 Units total) by mouth every 7 (seven) days. (Patient not taking: Reported on 07/05/2020) 12 capsule 1  ? ?No facility-administered medications prior to visit.  ? ? ?No Known Allergies ? ?   ?Objective:  ?  ?BP 116/78   Pulse 74   Wt 144 lb 9.6 oz (65.6 kg)   SpO2 96%   BMI 27.32 kg/m?  ?Wt Readings from Last 3 Encounters:  ?06/09/21 144 lb 9.6 oz (65.6 kg)  ?07/05/20 145 lb 12.8 oz (66.1 kg)  ?04/13/20 147 lb (66.7 kg)  ? ? ?Physical Exam ?Constitutional:   ?   Appearance: She is well-developed.  ?HENT:  ?   Head: Normocephalic and atraumatic.  ?   Right Ear: Hearing, tympanic membrane, ear canal and external ear normal.  ?   Left Ear: Hearing, tympanic membrane, ear canal and external ear normal.  ?   Nose: Nose normal.  ?   Right Turbinates: Not enlarged.  ?   Left Turbinates: Not enlarged.  ?   Mouth/Throat:  ?   Lips: Pink.  ?   Mouth: Mucous membranes are moist.  ?   Dentition: No dental tenderness, gingival swelling,  dental abscesses or gum lesions.  ?   Pharynx: No oropharyngeal exudate.  ?Eyes:  ?   General: No scleral icterus.    ?   Right eye: No discharge.  ?   Extraocular Movements: Extraocular movements intact.  ?   Conjunctiva/sclera: Conjunctivae normal.  ?   Pupils: Pupils are equal, round, and reactive to light.  ?Neck:  ?   Thyroid: No thyromegaly.  ?   Trachea: No tracheal deviation.  ?Cardiovascular:  ?   Rate and Rhythm: Normal rate and regular rhythm.  ?   Heart sounds: Normal heart sounds. No murmur heard. ?  No friction rub.  ?Pulmonary:  ?   Effort: Pulmonary effort is normal. No accessory muscle usage or respiratory distress.  ?   Breath sounds: Normal breath sounds. No decreased breath sounds, wheezing, rhonchi or rales.  ?Abdominal:  ?   General: Bowel sounds are normal.  There is no distension.  ?   Palpations: Abdomen is soft. There is no mass.  ?   Tenderness: There is no abdominal tenderness. There is no right CVA tenderness, left CVA tenderness, guarding or rebound.  ?   Hernia: No hernia is present.  ?Musculoskeletal:     ?   General: No tenderness or deformity. Normal range of motion.  ?   Cervical back: Normal range of motion and neck supple.  ?Lymphadenopathy:  ?   Cervical: No cervical adenopathy.  ?Skin: ?   General: Skin is warm and dry.  ?   Findings: No erythema.  ?Neurological:  ?   Mental Status: She is alert and oriented to person, place, and time.  ?   Cranial Nerves: No cranial nerve deficit.  ?   Motor: Motor function is intact.  ?   Coordination: Coordination is intact. Coordination normal.  ?   Gait: Gait is intact.  ?   Deep Tendon Reflexes:  ?   Reflex Scores: ?     Patellar reflexes are 1+ on the right side and 1+ on the left side. ?Psychiatric:     ?   Attention and Perception: Attention normal.     ?   Mood and Affect: Mood normal.     ?   Speech: Speech normal.     ?   Behavior: Behavior normal.     ?   Thought Content: Thought content normal.     ?   Judgment: Judgment normal.   ? ? ? ? ?   ?Patient has been counseled extensively about nutrition and exercise as well as the importance of adherence with medications and regular follow-up. The patient was given clear instructions to go to ER or retu

## 2021-06-10 LAB — CMP14+EGFR
ALT: 28 IU/L (ref 0–32)
AST: 17 IU/L (ref 0–40)
Albumin/Globulin Ratio: 1.7 (ref 1.2–2.2)
Albumin: 4.7 g/dL (ref 3.8–4.8)
Alkaline Phosphatase: 75 IU/L (ref 44–121)
BUN/Creatinine Ratio: 11 (ref 9–23)
BUN: 9 mg/dL (ref 6–20)
Bilirubin Total: 0.3 mg/dL (ref 0.0–1.2)
CO2: 23 mmol/L (ref 20–29)
Calcium: 9.4 mg/dL (ref 8.7–10.2)
Chloride: 102 mmol/L (ref 96–106)
Creatinine, Ser: 0.84 mg/dL (ref 0.57–1.00)
Globulin, Total: 2.8 g/dL (ref 1.5–4.5)
Glucose: 89 mg/dL (ref 70–99)
Potassium: 3.9 mmol/L (ref 3.5–5.2)
Sodium: 138 mmol/L (ref 134–144)
Total Protein: 7.5 g/dL (ref 6.0–8.5)
eGFR: 92 mL/min/{1.73_m2} (ref >59)

## 2021-06-10 LAB — MICROSCOPIC EXAMINATION
Bacteria, UA: NONE SEEN
Casts: NONE SEEN /lpf
WBC, UA: NONE SEEN /hpf (ref 0–5)

## 2021-06-10 LAB — CBC
Hematocrit: 43.4 % (ref 34.0–46.6)
Hemoglobin: 14.4 g/dL (ref 11.1–15.9)
MCH: 28.7 pg (ref 26.6–33.0)
MCHC: 33.2 g/dL (ref 31.5–35.7)
MCV: 87 fL (ref 79–97)
Platelets: 344 10*3/uL (ref 150–450)
RBC: 5.01 x10E6/uL (ref 3.77–5.28)
RDW: 13.2 % (ref 11.7–15.4)
WBC: 8.7 10*3/uL (ref 3.4–10.8)

## 2021-06-10 LAB — HIV ANTIBODY (ROUTINE TESTING W REFLEX): HIV Screen 4th Generation wRfx: NONREACTIVE

## 2021-06-10 LAB — URINALYSIS, COMPLETE
Bilirubin, UA: NEGATIVE
Glucose, UA: NEGATIVE
Ketones, UA: NEGATIVE
Nitrite, UA: NEGATIVE
Protein,UA: NEGATIVE
RBC, UA: NEGATIVE
Specific Gravity, UA: 1.011 (ref 1.005–1.030)
Urobilinogen, Ur: 0.2 mg/dL (ref 0.2–1.0)
pH, UA: 7 (ref 5.0–7.5)

## 2021-06-10 LAB — LIPID PANEL
Chol/HDL Ratio: 4.5 ratio — ABNORMAL HIGH (ref 0.0–4.4)
Cholesterol, Total: 193 mg/dL (ref 100–199)
HDL: 43 mg/dL (ref >39)
LDL Chol Calc (NIH): 129 mg/dL — ABNORMAL HIGH (ref 0–99)
Triglycerides: 116 mg/dL (ref 0–149)
VLDL Cholesterol Cal: 21 mg/dL (ref 5–40)

## 2021-06-10 LAB — VITAMIN D 25 HYDROXY (VIT D DEFICIENCY, FRACTURES): Vit D, 25-Hydroxy: 17.2 ng/mL — ABNORMAL LOW (ref 30.0–100.0)

## 2021-06-12 ENCOUNTER — Other Ambulatory Visit: Payer: Self-pay

## 2021-06-12 ENCOUNTER — Other Ambulatory Visit: Payer: Self-pay | Admitting: Nurse Practitioner

## 2021-06-12 DIAGNOSIS — B3731 Acute candidiasis of vulva and vagina: Secondary | ICD-10-CM

## 2021-06-12 LAB — CERVICOVAGINAL ANCILLARY ONLY
Bacterial Vaginitis (gardnerella): NEGATIVE
Candida Glabrata: POSITIVE — AB
Candida Vaginitis: NEGATIVE
Chlamydia: NEGATIVE
Comment: NEGATIVE
Comment: NEGATIVE
Comment: NEGATIVE
Comment: NEGATIVE
Comment: NEGATIVE
Comment: NORMAL
Neisseria Gonorrhea: NEGATIVE
Trichomonas: NEGATIVE

## 2021-06-12 MED ORDER — FLUCONAZOLE 150 MG PO TABS
150.0000 mg | ORAL_TABLET | Freq: Once | ORAL | 0 refills | Status: AC
  Filled 2021-06-12: qty 1, 1d supply, fill #0

## 2021-06-13 ENCOUNTER — Other Ambulatory Visit: Payer: Self-pay

## 2021-07-10 ENCOUNTER — Other Ambulatory Visit: Payer: Self-pay

## 2021-07-11 ENCOUNTER — Other Ambulatory Visit: Payer: Self-pay

## 2021-08-23 ENCOUNTER — Encounter: Payer: Self-pay | Admitting: Nurse Practitioner

## 2021-08-23 ENCOUNTER — Other Ambulatory Visit: Payer: Self-pay

## 2021-08-23 ENCOUNTER — Ambulatory Visit: Payer: Self-pay | Attending: Nurse Practitioner | Admitting: Nurse Practitioner

## 2021-08-23 VITALS — BP 105/72 | HR 99 | Wt 147.4 lb

## 2021-08-23 DIAGNOSIS — M7712 Lateral epicondylitis, left elbow: Secondary | ICD-10-CM

## 2021-08-23 DIAGNOSIS — M7711 Lateral epicondylitis, right elbow: Secondary | ICD-10-CM

## 2021-08-23 DIAGNOSIS — M2550 Pain in unspecified joint: Secondary | ICD-10-CM

## 2021-08-23 MED ORDER — DICLOFENAC SODIUM 1 % EX GEL
4.0000 g | Freq: Four times a day (QID) | CUTANEOUS | 1 refills | Status: AC | PRN
  Filled 2021-08-23: qty 200, 12d supply, fill #0

## 2021-08-23 NOTE — Progress Notes (Signed)
Assessment & Plan:  [name] was seen today for knee pain and joint swelling.  Diagnoses and all orders for this visit:  Lateral epicondylitis of both elbows -     diclofenac Sodium (VOLTAREN) 1 % GEL; Apply 4 g topically 4 (four) times daily as needed. Recommended copper elbow sleeves  Arthralgia of multiple joints -     Rheumatoid factor    Patient has been counseled on age-appropriate routine health concerns for screening and prevention. These are reviewed and up-to-date. Referrals have been placed accordingly. Immunizations are up-to-date or declined.    Subjective:   Chief Complaint  Patient presents with   Knee Pain   Joint Swelling    elbows   HPI [name] 36 y.o. female presents to office today with complaints of multiple arthralgias including the bilateral knees and bilateral hands.  Arthralgias have been present for several months. Pain is  described as aching, and is intermittent .  Associated symptoms include: edema and tenderness.  The patient has tried nothing for pain relief.  Related to injury:   no. 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 She also endorses symptoms of bilateral lateral epicondylitis.  Knee pain is worsened with prolonged walking and walking up nad down stairs.   Knees  Review of Systems  Constitutional:  Negative for fever, malaise/fatigue and weight loss.  HENT: Negative.  Negative for nosebleeds.   Eyes: Negative.  Negative for blurred vision, double vision and photophobia.  Respiratory: Negative.  Negative for cough and shortness of breath.   Cardiovascular: Negative.  Negative for chest pain, palpitations and leg swelling.  Gastrointestinal: Negative.  Negative for heartburn, nausea and vomiting.  Musculoskeletal:  Positive for joint pain. Negative for myalgias.  Neurological: Negative.  Negative for dizziness, focal weakness, seizures and headaches.   Psychiatric/Behavioral: Negative.  Negative for suicidal ideas.    Past Medical History:  Diagnosis Date   Medical history non-contributory     Past Surgical History:  Procedure Laterality Date   NO PAST SURGERIES      Family History  Problem Relation Age of Onset   Alcohol abuse Neg Hx     Social History Reviewed with no changes to be made today.   Outpatient Medications Prior to Visit  Medication Sig Dispense Refill   cyclobenzaprine (FLEXERIL) 10 MG tablet Take 1 tablet (10 mg total) by mouth 2 (two) times daily as needed for muscle spasms. 60 tablet 1   Vitamin D, Ergocalciferol, (DRISDOL) 1.25 MG (50000 UNIT) CAPS capsule Take 1 capsule (50,000 Units total) by mouth every 7 (seven) days. 12 capsule 1   levonorgestrel-ethinyl estradiol (NORDETTE) 0.15-30 MG-MCG tablet Take 1 tablet by mouth daily. (Patient not taking: Reported on 08/23/2021) 28 tablet 11   No facility-administered medications prior to visit.    No Known Allergies     Objective:    BP 105/72   Pulse 99   Wt 147 lb 6.4 oz (66.9 kg)   SpO2 98%   BMI 27.85 kg/m  Wt Readings from Last 3 Encounters:  08/23/21 147 lb 6.4 oz (66.9 kg)  06/09/21 144 lb 9.6 oz (65.6 kg)  07/05/20 145 lb 12.8 oz (66.1 kg)    Physical Exam Vitals and nursing note reviewed.  Constitutional:      Appearance: She is well-developed.  HENT:     Head: Normocephalic and atraumatic.  Cardiovascular:     Rate and Rhythm: Normal rate  and regular rhythm.     Heart sounds: Normal heart sounds. No murmur heard.   No friction rub. No gallop.  Pulmonary:     Effort: Pulmonary effort is normal. No tachypnea or respiratory distress.     Breath sounds: Normal breath sounds. No decreased breath sounds, wheezing, rhonchi or rales.  Chest:     Chest wall: No tenderness.  Abdominal:     General: Bowel sounds are normal.     Palpations: Abdomen is soft.  Musculoskeletal:        General: Normal range of motion.     Right elbow:  Tenderness present in lateral epicondyle.     Left elbow: Tenderness present in lateral epicondyle.     Cervical back: Normal range of motion.  Skin:    General: Skin is warm and dry.  Neurological:     Mental Status: She is alert and oriented to person, place, and time.     Coordination: Coordination normal.  Psychiatric:        Behavior: Behavior normal. Behavior is cooperative.        Thought Content: Thought content normal.        Judgment: Judgment normal.         Patient has been counseled extensively about nutrition and exercise as well as the importance of adherence with medications and regular follow-up. The patient was given clear instructions to go to ER or return to medical center if symptoms don't improve, worsen or new problems develop. The patient verbalized understanding.   Follow-up: Return for 3 weeks virtual on tuesday for elbow and knee pain.   Claiborne Rigg, FNP-BC [facility] [place], [place] [phone]   08/23/2021, 2:04 PM

## 2021-08-24 LAB — RHEUMATOID FACTOR: Rheumatoid fact SerPl-aCnc: <10 IU/mL (ref <14.0)

## 2021-08-28 ENCOUNTER — Other Ambulatory Visit (HOSPITAL_COMMUNITY): Payer: Self-pay

## 2021-10-02 ENCOUNTER — Other Ambulatory Visit: Payer: Self-pay

## 2021-10-03 ENCOUNTER — Other Ambulatory Visit: Payer: Self-pay

## 2021-12-21 ENCOUNTER — Other Ambulatory Visit: Payer: Self-pay

## 2021-12-22 ENCOUNTER — Other Ambulatory Visit: Payer: Self-pay

## 2022-03-13 ENCOUNTER — Other Ambulatory Visit: Payer: Self-pay

## 2022-03-21 ENCOUNTER — Ambulatory Visit: Payer: Self-pay | Attending: Nurse Practitioner

## 2022-03-21 ENCOUNTER — Other Ambulatory Visit: Payer: Self-pay

## 2022-03-21 DIAGNOSIS — Z23 Encounter for immunization: Secondary | ICD-10-CM | POA: Insufficient documentation

## 2022-03-21 NOTE — Progress Notes (Signed)
Influenza vaccine administered per protocols.  Information sheet given. Patient denies and pain or discomfort at injection site. Tolerated injection well no reaction.

## 2022-04-11 ENCOUNTER — Other Ambulatory Visit: Payer: Self-pay

## 2022-04-11 ENCOUNTER — Other Ambulatory Visit: Payer: Self-pay | Admitting: Nurse Practitioner

## 2022-04-11 DIAGNOSIS — Z3041 Encounter for surveillance of contraceptive pills: Secondary | ICD-10-CM

## 2022-04-11 MED ORDER — LEVONORGESTREL-ETHINYL ESTRAD 0.15-30 MG-MCG PO TABS
1.0000 | ORAL_TABLET | Freq: Every day | ORAL | 0 refills | Status: DC
  Filled 2022-04-11 – 2022-05-11 (×3): qty 28, 28d supply, fill #0

## 2022-05-01 ENCOUNTER — Other Ambulatory Visit: Payer: Self-pay

## 2022-05-11 ENCOUNTER — Other Ambulatory Visit: Payer: Self-pay

## 2022-05-31 ENCOUNTER — Other Ambulatory Visit: Payer: Self-pay | Admitting: Family Medicine

## 2022-05-31 DIAGNOSIS — Z3041 Encounter for surveillance of contraceptive pills: Secondary | ICD-10-CM

## 2022-06-02 ENCOUNTER — Other Ambulatory Visit: Payer: Self-pay | Admitting: Family Medicine

## 2022-06-02 DIAGNOSIS — Z3041 Encounter for surveillance of contraceptive pills: Secondary | ICD-10-CM

## 2022-06-04 ENCOUNTER — Other Ambulatory Visit: Payer: Self-pay

## 2022-06-04 ENCOUNTER — Other Ambulatory Visit (HOSPITAL_COMMUNITY): Payer: Self-pay

## 2022-06-04 ENCOUNTER — Encounter (HOSPITAL_COMMUNITY): Payer: Self-pay

## 2022-06-04 MED ORDER — LEVONORGESTREL-ETHINYL ESTRAD 0.15-30 MG-MCG PO TABS
1.0000 | ORAL_TABLET | Freq: Every day | ORAL | 0 refills | Status: DC
  Filled 2022-06-04 – 2022-06-08 (×3): qty 28, 28d supply, fill #0

## 2022-06-07 ENCOUNTER — Other Ambulatory Visit (HOSPITAL_COMMUNITY): Payer: Self-pay

## 2022-06-08 ENCOUNTER — Other Ambulatory Visit: Payer: Self-pay

## 2022-06-08 ENCOUNTER — Ambulatory Visit: Payer: Self-pay | Admitting: Nurse Practitioner

## 2022-06-26 ENCOUNTER — Ambulatory Visit: Payer: Self-pay | Attending: Nurse Practitioner | Admitting: Nurse Practitioner

## 2022-06-26 ENCOUNTER — Other Ambulatory Visit: Payer: Self-pay

## 2022-06-26 ENCOUNTER — Other Ambulatory Visit (HOSPITAL_COMMUNITY)
Admission: RE | Admit: 2022-06-26 | Discharge: 2022-06-26 | Disposition: A | Discharge status: Home / Self Care | Payer: Self-pay | Source: Ambulatory Visit | Attending: Nurse Practitioner | Admitting: Nurse Practitioner

## 2022-06-26 VITALS — BP 129/86 | HR 73 | Ht 61.0 in | Wt 154.6 lb

## 2022-06-26 DIAGNOSIS — E876 Hypokalemia: Secondary | ICD-10-CM

## 2022-06-26 DIAGNOSIS — Z124 Encounter for screening for malignant neoplasm of cervix: Secondary | ICD-10-CM | POA: Insufficient documentation

## 2022-06-26 DIAGNOSIS — Z862 Personal history of diseases of the blood and blood-forming organs and certain disorders involving the immune mechanism: Secondary | ICD-10-CM

## 2022-06-26 DIAGNOSIS — E559 Vitamin D deficiency, unspecified: Secondary | ICD-10-CM

## 2022-06-26 DIAGNOSIS — R8290 Unspecified abnormal findings in urine: Secondary | ICD-10-CM

## 2022-06-26 MED ORDER — VITAMIN D (ERGOCALCIFEROL) 1.25 MG (50000 UNIT) PO CAPS
50000.0000 [IU] | ORAL_CAPSULE | ORAL | 1 refills | Status: DC
Start: 2022-06-26 — End: 2023-04-29
  Filled 2022-06-26: qty 12, 84d supply, fill #0
  Filled 2022-12-12 – 2022-12-13 (×2): qty 12, 84d supply, fill #1

## 2022-06-26 NOTE — Progress Notes (Signed)
Assessment & Plan:  Xana was seen today for gynecologic exam.  Diagnoses and all orders for this visit:  Encounter for Papanicolaou smear for cervical cancer screening -     Cervicovaginal ancillary only -     Cytology - PAP  Vitamin D deficiency disease -     VITAMIN D 25 Hydroxy (Vit-D Deficiency, Fractures) -     Vitamin D, Ergocalciferol, (DRISDOL) 1.25 MG (50000 UNIT) CAPS capsule; Take 1 capsule (50,000 Units total) by mouth every 7 (seven) days. Please fill as a 90 day supply  History of anemia -     CBC with Differential  Hypokalemia -     CMP14+EGFR  Abnormal urine sediment -     Urinalysis, Complete    Patient has been counseled on age-appropriate routine health concerns for screening and prevention. These are reviewed and up-to-date. Referrals have been placed accordingly. Immunizations are up-to-date or declined.    Subjective:   Chief Complaint  Patient presents with   Gynecologic Exam   Gynecologic Exam Pertinent negatives include no abdominal pain, chills, fever, flank pain or rash.   [name] [place] 37 y.o. female presents to office today for pap smear.  She notes abnormal appearing white sediments in her urine. Denies any other GU symptoms.  Review of Systems  Constitutional: Negative.  Negative for chills, fever, malaise/fatigue and weight loss.  Respiratory: Negative.  Negative for cough, shortness of breath and wheezing.   Cardiovascular: Negative.  Negative for chest pain, orthopnea and leg swelling.  Gastrointestinal:  Negative for abdominal pain.  Genitourinary: Negative.  Negative for flank pain.  Skin: Negative.  Negative for rash.  Psychiatric/Behavioral:  Negative for suicidal ideas.     Past Medical History:  Diagnosis Date   Medical history non-contributory     Past Surgical History:  Procedure Laterality Date   NO PAST SURGERIES      Family History  Problem Relation Age of Onset   Alcohol abuse Neg Hx     Social  History Reviewed with no changes to be made today.   Outpatient Medications Prior to Visit  Medication Sig Dispense Refill   cyclobenzaprine (FLEXERIL) 10 MG tablet Take 1 tablet (10 mg total) by mouth 2 (two) times daily as needed for muscle spasms. 60 tablet 1   levonorgestrel-ethinyl estradiol (NORDETTE) 0.15-30 MG-MCG tablet Take 1 tablet by mouth daily. 28 tablet 0   Vitamin D, Ergocalciferol, (DRISDOL) 1.25 MG (50000 UNIT) CAPS capsule Take 1 capsule (50,000 Units total) by mouth every 7 (seven) days. (Patient not taking: Reported on 06/26/2022) 12 capsule 1   No facility-administered medications prior to visit.    No Known Allergies     Objective:    BP 129/86   Pulse 73   Ht 5\' 1"  (1.549 m)   Wt 154 lb 9.6 oz (70.1 kg)   LMP 06/15/2022 (Approximate)   SpO2 100%   BMI 29.21 kg/m  Wt Readings from Last 3 Encounters:  06/26/22 154 lb 9.6 oz (70.1 kg)  08/23/21 147 lb 6.4 oz (66.9 kg)  06/09/21 144 lb 9.6 oz (65.6 kg)    Physical Exam Exam conducted with a chaperone present.  Constitutional:      Appearance: She is well-developed.  HENT:     Head: Normocephalic.  Cardiovascular:     Rate and Rhythm: Normal rate and regular rhythm.     Heart sounds: Normal heart sounds.  Pulmonary:     Effort: Pulmonary effort is normal.  Breath sounds: Normal breath sounds.  Abdominal:     General: Bowel sounds are normal.     Palpations: Abdomen is soft.     Hernia: There is no hernia in the left inguinal area.  Genitourinary:    Exam position: Lithotomy position.     Labia:        Right: No rash, tenderness, lesion or injury.        Left: No rash, tenderness, lesion or injury.      Vagina: Normal. No signs of injury and foreign body. No vaginal discharge, erythema, tenderness or bleeding.     Cervix: Normal.     Uterus: Not deviated and not enlarged.      Adnexa:        Right: No mass, tenderness or fullness.         Left: No mass, tenderness or fullness.       Rectum:  Normal. No external hemorrhoid.  Lymphadenopathy:     Lower Body: No right inguinal adenopathy. No left inguinal adenopathy.  Skin:    General: Skin is warm and dry.  Neurological:     Mental Status: She is alert and oriented to person, place, and time.  Psychiatric:        Behavior: Behavior normal.        Thought Content: Thought content normal.        Judgment: Judgment normal.          Patient has been counseled extensively about nutrition and exercise as well as the importance of adherence with medications and regular follow-up. The patient was given clear instructions to go to ER or return to medical center if symptoms don't improve, worsen or new problems develop. The patient verbalized understanding.   Follow-up: Return if symptoms worsen or fail to improve.   Claiborne Rigg, FNP-BC [facility] [place], [place] [phone]   06/26/2022, 4:15 PM

## 2022-06-27 ENCOUNTER — Other Ambulatory Visit: Payer: Self-pay | Admitting: Nurse Practitioner

## 2022-06-27 DIAGNOSIS — B3731 Acute candidiasis of vulva and vagina: Secondary | ICD-10-CM

## 2022-06-27 LAB — MICROSCOPIC EXAMINATION
Bacteria, UA: NONE SEEN
Casts: NONE SEEN /lpf
WBC, UA: NONE SEEN /hpf (ref 0–5)

## 2022-06-27 LAB — CBC WITH DIFFERENTIAL/PLATELET
Basophils Absolute: 0.1 10*3/uL (ref 0.0–0.2)
Basos: 0 %
EOS (ABSOLUTE): 0.1 10*3/uL (ref 0.0–0.4)
Eos: 1 %
Hematocrit: 42.2 % (ref 34.0–46.6)
Hemoglobin: 14.1 g/dL (ref 11.1–15.9)
Immature Grans (Abs): 0 10*3/uL (ref 0.0–0.1)
Immature Granulocytes: 0 %
Lymphocytes Absolute: 3.9 10*3/uL — ABNORMAL HIGH (ref 0.7–3.1)
Lymphs: 33 %
MCH: 29.7 pg (ref 26.6–33.0)
MCHC: 33.4 g/dL (ref 31.5–35.7)
MCV: 89 fL (ref 79–97)
Monocytes Absolute: 0.6 10*3/uL (ref 0.1–0.9)
Monocytes: 5 %
Neutrophils Absolute: 7.2 10*3/uL — ABNORMAL HIGH (ref 1.4–7.0)
Neutrophils: 61 %
Platelets: 311 10*3/uL (ref 150–450)
RBC: 4.74 x10E6/uL (ref 3.77–5.28)
RDW: 13.3 % (ref 11.7–15.4)
WBC: 11.8 10*3/uL — ABNORMAL HIGH (ref 3.4–10.8)

## 2022-06-27 LAB — URINALYSIS, COMPLETE
Bilirubin, UA: NEGATIVE
Glucose, UA: NEGATIVE
Ketones, UA: NEGATIVE
Leukocytes,UA: NEGATIVE
Nitrite, UA: NEGATIVE
Protein,UA: NEGATIVE
RBC, UA: NEGATIVE
Specific Gravity, UA: 1.012 (ref 1.005–1.030)
Urobilinogen, Ur: 0.2 mg/dL (ref 0.2–1.0)
pH, UA: 5.5 (ref 5.0–7.5)

## 2022-06-27 LAB — CMP14+EGFR
ALT: 142 IU/L — ABNORMAL HIGH (ref 0–32)
AST: 81 IU/L — ABNORMAL HIGH (ref 0–40)
Albumin/Globulin Ratio: 1.5 (ref 1.2–2.2)
Albumin: 4.5 g/dL (ref 3.9–4.9)
Alkaline Phosphatase: 63 IU/L (ref 44–121)
BUN/Creatinine Ratio: 13 (ref 9–23)
BUN: 10 mg/dL (ref 6–20)
Bilirubin Total: 0.3 mg/dL (ref 0.0–1.2)
CO2: 22 mmol/L (ref 20–29)
Calcium: 9.5 mg/dL (ref 8.7–10.2)
Chloride: 101 mmol/L (ref 96–106)
Creatinine, Ser: 0.8 mg/dL (ref 0.57–1.00)
Globulin, Total: 3 g/dL (ref 1.5–4.5)
Glucose: 81 mg/dL (ref 70–99)
Potassium: 4.1 mmol/L (ref 3.5–5.2)
Sodium: 137 mmol/L (ref 134–144)
Total Protein: 7.5 g/dL (ref 6.0–8.5)
eGFR: 97 mL/min/{1.73_m2} (ref >59)

## 2022-06-27 LAB — VITAMIN D 25 HYDROXY (VIT D DEFICIENCY, FRACTURES): Vit D, 25-Hydroxy: 16.2 ng/mL — ABNORMAL LOW (ref 30.0–100.0)

## 2022-06-27 LAB — CERVICOVAGINAL ANCILLARY ONLY
Bacterial Vaginitis (gardnerella): NEGATIVE
Candida Glabrata: POSITIVE — AB
Candida Vaginitis: NEGATIVE
Chlamydia: NEGATIVE
Comment: NEGATIVE
Comment: NEGATIVE
Comment: NEGATIVE
Comment: NEGATIVE
Comment: NEGATIVE
Comment: NORMAL
Neisseria Gonorrhea: NEGATIVE
Trichomonas: NEGATIVE

## 2022-06-27 MED ORDER — FLUCONAZOLE 150 MG PO TABS
150.0000 mg | ORAL_TABLET | Freq: Once | ORAL | 0 refills | Status: AC
Start: 2022-06-27 — End: 2022-06-29
  Filled 2022-06-27: qty 1, 1d supply, fill #0

## 2022-06-28 ENCOUNTER — Other Ambulatory Visit: Payer: Self-pay | Admitting: Family Medicine

## 2022-06-28 ENCOUNTER — Other Ambulatory Visit: Payer: Self-pay

## 2022-06-28 ENCOUNTER — Telehealth: Payer: Self-pay | Admitting: *Deleted

## 2022-06-28 DIAGNOSIS — Z3041 Encounter for surveillance of contraceptive pills: Secondary | ICD-10-CM

## 2022-06-28 LAB — CYTOLOGY - PAP
Comment: NEGATIVE
Diagnosis: NEGATIVE
High risk HPV: NEGATIVE

## 2022-06-28 MED ORDER — LEVONORGESTREL-ETHINYL ESTRAD 0.15-30 MG-MCG PO TABS
1.0000 | ORAL_TABLET | Freq: Every day | ORAL | 6 refills | Status: AC
Start: 2022-06-28 — End: ?
  Filled 2022-06-28: qty 28, 28d supply, fill #0
  Filled 2022-07-04: qty 84, 84d supply, fill #0
  Filled 2022-08-27: qty 84, 84d supply, fill #1
  Filled 2022-12-13: qty 28, 28d supply, fill #2

## 2022-06-28 NOTE — Telephone Encounter (Signed)
Noted  

## 2022-06-28 NOTE — Telephone Encounter (Signed)
Pt given lab results via interpreter Mirsha ID [phone] per notes of Z. Meredeth Ide, NP from 06/27/22 on 06/28/22. Pt verbalized understanding normal urine, vit D is low and will send script for weekly vitamin D to take for 3 months then can purchase Vit D OTC 2000 units daily, liver enzymes elevated repeat labs in 7-10 days. Appt scheduled for 07/12/22 at 1:50 pm. Reviewed elevated WBC count will need to repeat labs. Could be viral but unclear at this time why WBC is increased. Vaginal swab positive for yeast will send diflucan. Patient asked if she can still have sex. Reviewed she can but may cause pain or worsening irritation. Patient verbalized she does have itching and irritation. Reviewed awaiting results for PAP .

## 2022-06-29 ENCOUNTER — Telehealth: Payer: Self-pay | Admitting: *Deleted

## 2022-06-29 NOTE — Telephone Encounter (Signed)
Noted  

## 2022-06-29 NOTE — Telephone Encounter (Signed)
Called patient via interpreter ID # [id number] to confirm with patient time change of lab appt. No f/u needed with provider after labs collected and scheduled in error. Confirmed appt for 07/12/22 at 2:00 pm. Patient asking if PAP results back. In review of chart no doc. Noted from PCP at this time.

## 2022-07-03 ENCOUNTER — Other Ambulatory Visit: Payer: Self-pay | Admitting: Nurse Practitioner

## 2022-07-03 DIAGNOSIS — R748 Abnormal levels of other serum enzymes: Secondary | ICD-10-CM

## 2022-07-04 ENCOUNTER — Other Ambulatory Visit: Payer: Self-pay

## 2022-07-12 ENCOUNTER — Other Ambulatory Visit: Payer: Self-pay | Admitting: Physician Assistant

## 2022-07-12 ENCOUNTER — Other Ambulatory Visit: Payer: Self-pay

## 2022-07-13 ENCOUNTER — Ambulatory Visit: Payer: Self-pay | Attending: Nurse Practitioner

## 2022-07-13 DIAGNOSIS — R748 Abnormal levels of other serum enzymes: Secondary | ICD-10-CM

## 2022-07-14 LAB — HEPATIC FUNCTION PANEL
ALT: 106 IU/L — ABNORMAL HIGH (ref 0–32)
AST: 62 IU/L — ABNORMAL HIGH (ref 0–40)
Albumin: 4.5 g/dL (ref 3.9–4.9)
Alkaline Phosphatase: 68 IU/L (ref 44–121)
Bilirubin Total: 0.4 mg/dL (ref 0.0–1.2)
Bilirubin, Direct: <0.1 mg/dL (ref 0.00–0.40)
Total Protein: 7.2 g/dL (ref 6.0–8.5)

## 2022-08-27 ENCOUNTER — Other Ambulatory Visit: Payer: Self-pay | Admitting: Nurse Practitioner

## 2022-08-27 ENCOUNTER — Other Ambulatory Visit: Payer: Self-pay

## 2022-08-27 DIAGNOSIS — R109 Unspecified abdominal pain: Secondary | ICD-10-CM

## 2022-08-27 MED ORDER — CYCLOBENZAPRINE HCL 10 MG PO TABS
10.0000 mg | ORAL_TABLET | Freq: Two times a day (BID) | ORAL | 1 refills | Status: DC | PRN
  Filled 2022-08-27: qty 60, 30d supply, fill #0

## 2022-08-29 ENCOUNTER — Other Ambulatory Visit: Payer: Self-pay

## 2022-12-13 ENCOUNTER — Other Ambulatory Visit: Payer: Self-pay

## 2022-12-16 ENCOUNTER — Other Ambulatory Visit: Payer: Self-pay

## 2022-12-16 ENCOUNTER — Other Ambulatory Visit (HOSPITAL_COMMUNITY): Payer: Self-pay

## 2022-12-16 ENCOUNTER — Encounter (HOSPITAL_COMMUNITY): Payer: Self-pay | Admitting: Emergency Medicine

## 2022-12-16 ENCOUNTER — Emergency Department (HOSPITAL_COMMUNITY)
Admission: EM | Admit: 2022-12-16 | Discharge: 2022-12-16 | Disposition: A | Discharge status: Home / Self Care | Payer: Self-pay | Attending: Emergency Medicine | Admitting: Emergency Medicine

## 2022-12-16 ENCOUNTER — Emergency Department (HOSPITAL_COMMUNITY): Payer: Self-pay

## 2022-12-16 DIAGNOSIS — R42 Dizziness and giddiness: Secondary | ICD-10-CM | POA: Insufficient documentation

## 2022-12-16 DIAGNOSIS — R220 Localized swelling, mass and lump, head: Secondary | ICD-10-CM | POA: Insufficient documentation

## 2022-12-16 DIAGNOSIS — Z20822 Contact with and (suspected) exposure to covid-19: Secondary | ICD-10-CM | POA: Insufficient documentation

## 2022-12-16 DIAGNOSIS — H538 Other visual disturbances: Secondary | ICD-10-CM | POA: Insufficient documentation

## 2022-12-16 DIAGNOSIS — R519 Headache, unspecified: Secondary | ICD-10-CM | POA: Insufficient documentation

## 2022-12-16 LAB — COMPREHENSIVE METABOLIC PANEL
ALT: 112 U/L — ABNORMAL HIGH (ref 0–44)
AST: 90 U/L — ABNORMAL HIGH (ref 15–41)
Albumin: 3.5 g/dL (ref 3.5–5.0)
Alkaline Phosphatase: 57 U/L (ref 38–126)
Anion gap: 14 (ref 5–15)
BUN: 9 mg/dL (ref 6–20)
CO2: 21 mmol/L — ABNORMAL LOW (ref 22–32)
Calcium: 9 mg/dL (ref 8.9–10.3)
Chloride: 104 mmol/L (ref 98–111)
Creatinine, Ser: 0.86 mg/dL (ref 0.44–1.00)
GFR, Estimated: >60 mL/min (ref >60)
Glucose, Bld: 132 mg/dL — ABNORMAL HIGH (ref 70–99)
Potassium: 3.6 mmol/L (ref 3.5–5.1)
Sodium: 139 mmol/L (ref 135–145)
Total Bilirubin: 0.3 mg/dL (ref 0.3–1.2)
Total Protein: 7.3 g/dL (ref 6.5–8.1)

## 2022-12-16 LAB — CBC
HCT: 42.7 % (ref 36.0–46.0)
Hemoglobin: 14 g/dL (ref 12.0–15.0)
MCH: 29 pg (ref 26.0–34.0)
MCHC: 32.8 g/dL (ref 30.0–36.0)
MCV: 88.4 fL (ref 80.0–100.0)
Platelets: 329 10*3/uL (ref 150–400)
RBC: 4.83 MIL/uL (ref 3.87–5.11)
RDW: 13.2 % (ref 11.5–15.5)
WBC: 7.9 10*3/uL (ref 4.0–10.5)
nRBC: 0 % (ref 0.0–0.2)

## 2022-12-16 LAB — HCG, SERUM, QUALITATIVE: Preg, Serum: NEGATIVE

## 2022-12-16 LAB — PROTIME-INR
INR: 1 (ref 0.8–1.2)
Prothrombin Time: 13.8 s (ref 11.4–15.2)

## 2022-12-16 LAB — DIFFERENTIAL
Abs Immature Granulocytes: 0.01 10*3/uL (ref 0.00–0.07)
Basophils Absolute: 0 10*3/uL (ref 0.0–0.1)
Basophils Relative: 0 %
Eosinophils Absolute: 0.1 10*3/uL (ref 0.0–0.5)
Eosinophils Relative: 2 %
Immature Granulocytes: 0 %
Lymphocytes Relative: 35 %
Lymphs Abs: 2.7 10*3/uL (ref 0.7–4.0)
Monocytes Absolute: 0.4 10*3/uL (ref 0.1–1.0)
Monocytes Relative: 5 %
Neutro Abs: 4.6 10*3/uL (ref 1.7–7.7)
Neutrophils Relative %: 58 %

## 2022-12-16 LAB — ETHANOL: Alcohol, Ethyl (B): <10 mg/dL (ref <10)

## 2022-12-16 LAB — I-STAT CHEM 8, ED
BUN: 10 mg/dL (ref 6–20)
Calcium, Ion: 1.12 mmol/L — ABNORMAL LOW (ref 1.15–1.40)
Chloride: 107 mmol/L (ref 98–111)
Creatinine, Ser: 0.8 mg/dL (ref 0.44–1.00)
Glucose, Bld: 132 mg/dL — ABNORMAL HIGH (ref 70–99)
HCT: 43 % (ref 36.0–46.0)
Hemoglobin: 14.6 g/dL (ref 12.0–15.0)
Potassium: 3.6 mmol/L (ref 3.5–5.1)
Sodium: 140 mmol/L (ref 135–145)
TCO2: 19 mmol/L — ABNORMAL LOW (ref 22–32)

## 2022-12-16 LAB — CBG MONITORING, ED: Glucose-Capillary: 129 mg/dL — ABNORMAL HIGH (ref 70–99)

## 2022-12-16 LAB — SARS CORONAVIRUS 2 BY RT PCR: SARS Coronavirus 2 by RT PCR: NEGATIVE

## 2022-12-16 LAB — APTT: aPTT: 27 s (ref 24–36)

## 2022-12-16 LAB — TROPONIN I (HIGH SENSITIVITY): Troponin I (High Sensitivity): 7 ng/L (ref <18)

## 2022-12-16 MED ORDER — MECLIZINE HCL 25 MG PO TABS
25.0000 mg | ORAL_TABLET | Freq: Three times a day (TID) | ORAL | 0 refills | Status: DC | PRN
Start: 2022-12-16 — End: 2023-04-29

## 2022-12-16 MED ORDER — SODIUM CHLORIDE 0.9 % IV BOLUS
1000.0000 mL | Freq: Once | INTRAVENOUS | Status: AC
  Administered 2022-12-16: 1000 mL via INTRAVENOUS

## 2022-12-16 MED ORDER — PROCHLORPERAZINE EDISYLATE 10 MG/2ML IJ SOLN
10.0000 mg | Freq: Once | INTRAMUSCULAR | Status: AC
  Administered 2022-12-16: 10 mg via INTRAVENOUS
  Filled 2022-12-16: qty 2

## 2022-12-16 MED ORDER — KETOROLAC TROMETHAMINE 15 MG/ML IJ SOLN: 15.0000 mg | Freq: Once | INTRAMUSCULAR | Status: DC

## 2022-12-16 MED ORDER — MAGNESIUM SULFATE 2 GM/50ML IV SOLN
2.0000 g | Freq: Once | INTRAVENOUS | Status: AC
  Administered 2022-12-16: 2 g via INTRAVENOUS
  Filled 2022-12-16: qty 50

## 2022-12-16 MED ORDER — LORAZEPAM 1 MG PO TABS: 1.0000 mg | ORAL_TABLET | Freq: Once | ORAL | Status: DC | PRN

## 2022-12-16 MED ORDER — DIPHENHYDRAMINE HCL 50 MG/ML IJ SOLN
25.0000 mg | Freq: Once | INTRAMUSCULAR | Status: AC
  Administered 2022-12-16: 25 mg via INTRAVENOUS
  Filled 2022-12-16: qty 1

## 2022-12-16 MED ORDER — LORAZEPAM 2 MG/ML IJ SOLN: 1.0000 mg | Freq: Once | INTRAMUSCULAR | Status: DC | PRN

## 2022-12-16 NOTE — ED Provider Notes (Signed)
[place] EMERGENCY DEPARTMENT AT [facility] Provider Note   CSN: [id number] Arrival date & time: 12/16/22  1009     History  Chief Complaint  Patient presents with   Dizziness   Blurred Vision    [name] Shirley Friar is a 37 y.o. female with overall noncontributory past medical history who presents with concern for facial swelling, red/puffy eyes for 2 weeks, intermittent blurred vision.  Endorses some left-sided facial pain intermittently, no pain at this time.  She does report that she has some difference in sensation of her face at this time.  Reports multiple episodes of dizziness, lightheadedness.  She reports several episodes of near syncope.  2 days ago patient got up to go to the bathroom and was dizzy, has been persistently dizzy since then.  Dizziness is worse with head movements but does not improve in any position.  Daughter reports having difficulty making eye contact and reading small print.   Dizziness      Home Medications Prior to Admission medications   Medication Sig Start Date End Date Taking? Authorizing Provider  cyclobenzaprine (FLEXERIL) 10 MG tablet Take 1 tablet (10 mg total) by mouth 2 (two) times daily as needed for muscle spasms. 08/27/22   Hoy Register, MD  levonorgestrel-ethinyl estradiol (NORDETTE) 0.15-30 MG-MCG tablet Take 1 tablet by mouth daily. 06/28/22   Hoy Register, MD  Vitamin D, Ergocalciferol, (DRISDOL) 1.25 MG (50000 UNIT) CAPS capsule Take 1 capsule (50,000 Units total) by mouth every 7 (seven) days. 06/26/22   Claiborne Rigg, NP      Allergies    Patient has no known allergies.    Review of Systems   Review of Systems  Neurological:  Positive for dizziness.  All other systems reviewed and are negative.   Physical Exam Updated Vital Signs BP 113/80   Pulse 73   Temp 99.3 F (37.4 C) (Oral)   Resp 17   Ht 5\' 1"  (1.549 m)   Wt 69.9 kg   SpO2 100%   BMI 29.10 kg/m  Physical Exam Vitals and nursing note  reviewed.  Constitutional:      General: She is not in acute distress.    Appearance: Normal appearance.  HENT:     Head: Normocephalic and atraumatic.  Eyes:     General:        Right eye: No discharge.        Left eye: No discharge.  Cardiovascular:     Rate and Rhythm: Normal rate and regular rhythm.     Heart sounds: No murmur heard.    No friction rub. No gallop.  Pulmonary:     Effort: Pulmonary effort is normal.     Breath sounds: Normal breath sounds.  Abdominal:     General: Bowel sounds are normal.     Palpations: Abdomen is soft.  Skin:    General: Skin is warm and dry.     Capillary Refill: Capillary refill takes less than 2 seconds.  Neurological:     Mental Status: She is alert and oriented to person, place, and time.     Comments: Cranial nerves II through XII grossly intact.  Intact finger-nose, intact heel-to-shin.  Unsteadiness with Romberg, but can compensate with a fairly normal gait with minimal assistance.  Alert and oriented x3.  Moves all 4 limbs spontaneously, normal coordination.  No pronator drift.  Intact strength 5 out of 5 bilateral upper and lower extremities.  Patient does endorse some difference in sensation,  reports that it feels more dull on the right compared to the left.  Psychiatric:        Mood and Affect: Mood normal.        Behavior: Behavior normal.     ED Results / Procedures / Treatments   Labs (all labs ordered are listed, but only abnormal results are displayed) Labs Reviewed  COMPREHENSIVE METABOLIC PANEL - Abnormal; Notable for the following components:      Result Value   CO2 21 (*)    Glucose, Bld 132 (*)    AST 90 (*)    ALT 112 (*)    All other components within normal limits  I-STAT CHEM 8, ED - Abnormal; Notable for the following components:   Glucose, Bld 132 (*)    Calcium, Ion 1.12 (*)    TCO2 19 (*)    All other components within normal limits  CBG MONITORING, ED - Abnormal; Notable for the following  components:   Glucose-Capillary 129 (*)    All other components within normal limits  SARS CORONAVIRUS 2 BY RT PCR  PROTIME-INR  APTT  CBC  DIFFERENTIAL  ETHANOL  HCG, SERUM, QUALITATIVE  TROPONIN I (HIGH SENSITIVITY)  TROPONIN I (HIGH SENSITIVITY)    EKG None  Radiology CT HEAD WO CONTRAST  Result Date: 12/16/2022 CLINICAL DATA:  Provided history: Vertigo, central. Intermittent blurred vision. Left facial pain. EXAM: CT HEAD WITHOUT CONTRAST TECHNIQUE: Contiguous axial images were obtained from the base of the skull through the vertex without intravenous contrast. RADIATION DOSE REDUCTION: This exam was performed according to the departmental dose-optimization program which includes automated exposure control, adjustment of the mA and/or kV according to patient size and/or use of iterative reconstruction technique. COMPARISON:  Head CT 01/17/2019. FINDINGS: Brain: Cerebral volume is normal. There is no acute intracranial hemorrhage. No demarcated cortical infarct. No extra-axial fluid collection. No evidence of an intracranial mass. No midline shift. Vascular: No hyperdense vessel. Skull: No calvarial fracture or aggressive osseous lesion. Sinuses/Orbits: No mass or acute finding within the imaged orbits. No significant paranasal sinus disease at the imaged levels. IMPRESSION: No evidence of an acute intracranial abnormality. Electronically Signed   By: Jackey Loge D.O.   On: 12/16/2022 12:27    Procedures Procedures    Medications Ordered in ED Medications  LORazepam (ATIVAN) injection 1 mg (has no administration in time range)  sodium chloride 0.9 % bolus 1,000 mL (1,000 mLs Intravenous New Bag/Given 12/16/22 1221)  prochlorperazine (COMPAZINE) injection 10 mg (10 mg Intravenous Given 12/16/22 1212)  diphenhydrAMINE (BENADRYL) injection 25 mg (25 mg Intravenous Given 12/16/22 1212)  magnesium sulfate IVPB 2 g 50 mL (0 g Intravenous Stopped 12/16/22 1300)    ED Course/ Medical  Decision Making/ A&P                                 Medical Decision Making Amount and/or Complexity of Data Reviewed Labs: ordered. Radiology: ordered.  Risk Prescription drug management.   This patient is a 37 y.o. female who presents to the ED for concern of dizziness, this involves an extensive number of treatment options, and is a complaint that carries with it a high risk of complications and morbidity. The emergent differential diagnosis prior to evaluation includes, but is not limited to,  BPPV, vestibular migraine, head trauma, AVM, intracranial tumor, multiple sclerosis, drug-related, CVA, vasovagal syncope, orthostatic hypotension, sepsis, hypoglycemia, electrolyte disturbance, anemia, anxiety/panic attack .  This is not an exhaustive differential.   Past Medical History / Co-morbidities / Social History: Overall noncontributory, previous endometriosis  Physical Exam: Physical exam performed. The pertinent findings include: Patient with significant dizziness with standing.  She does not have reproducible vertigo with Dix-Hallpike, but any head movement worsens her sensation of dizziness.  Unsteadiness with Romberg and difficulty walking.  Lab Tests: I ordered, and personally interpreted labs.  The pertinent results include: CMP with moderately elevated AST, ALT, otherwise overall unremarkable, CBC unremarkable, normal troponin, normal ethanol level,   Imaging Studies: I ordered imaging studies including CT head without contrast. I independently visualized and interpreted imaging which showed no evidence of acute intracranial abnormality. I agree with the radiologist interpretation.   Cardiac Monitoring:  The patient was maintained on a cardiac monitor.  My attending physician Dr. Andria Meuse viewed and interpreted the cardiac monitored which showed an underlying rhythm of: NSR. I agree with this interpretation.   Medications: I ordered medication including fluids, migraine  cocktail for dizziness, headache.  Patient continuing to have persistent dizziness, no findings to explain her symptoms on CT, given her findings do not reproducibly consistent with BPPV I do think that an MR is warranted at this time to evaluate for possible stroke   3:08 PM Care of Adelei Arnold Urbina transferred to PA [facility] and Dr. Wilkie Aye at the end of my shift as the patient will require reassessment once labs/imaging have resulted. Patient presentation, ED course, and plan of care discussed with review of all pertinent labs and imaging. Please see his/her note for further details regarding further ED course and disposition. Plan at time of handoff is pending MRI, if she continues to have persistent dizziness may need follow-up with ENT versus neurology, will discharge with Antivert. This may be altered or completely changed at the discretion of the oncoming team pending results of further workup.   Final Clinical Impression(s) / ED Diagnoses Final diagnoses:  None    Rx / DC Orders ED Discharge Orders     None         Olene Floss, PA-C 12/16/22 1508    Anders Simmonds T, DO 12/18/22 [id number]

## 2022-12-16 NOTE — ED Triage Notes (Signed)
Patient arrives for eval of red/puffy eyes x 2 weeks with associated intermittent blurred vision. States left facial pain at this time but no pain at this time. Two days ago at midnight patient got up to go to the bathroom and was dizzy, almost causing her to fall, and the dizziness has not gone away since then. Daughter states she is not making good eye contact and she is unable to read small print d/t blurred vision.

## 2022-12-16 NOTE — ED Notes (Signed)
Lab reports adding on troponin to previous lab draw.

## 2022-12-16 NOTE — Discharge Instructions (Signed)
After receiving medications in the emergency department you state your symptoms resolved.  You did not want to wait for the MRI.  I have given you referral to neurology.  Please follow-up with them.  If your symptoms return or you have any concerning symptoms return to the emergency room.  I have sent meclizine to the pharmacy for you to keep on hand for dizziness.

## 2022-12-16 NOTE — ED Notes (Signed)
Patient reports feeling better and no longer dizzy. States she does not think she needs a MRI anymore. EDP made aware.

## 2022-12-16 NOTE — ED Provider Notes (Signed)
Signout received on this 37 year old female who is pending an MRI.  See previous note for full details but essentially she presented for dizziness, and vision change for the past couple weeks that has occurred intermittently.  She does have history of migraines and does have a headache.  Received migraine cocktail, and is currently receiving fluid bolus.  Physical Exam  BP (!) 105/58   Pulse 68   Temp 98.9 F (37.2 C) (Oral)   Resp 17   Ht 5\' 1"  (1.549 m)   Wt 69.9 kg   SpO2 99%   BMI 29.10 kg/m    Procedures  Procedures  ED Course / MDM    Medical Decision Making Amount and/or Complexity of Data Reviewed Labs: ordered. Radiology: ordered.  Risk Prescription drug management.   While waiting for the MRI the patient after receiving the full migraine cocktail states that she has resolution of symptoms and would like to not wait for the MRI.  She would like to follow-up with neurology outpatient.  If she has any worsening symptoms she will return to the emergency room for evaluation.  She understands that without the MRI we cannot rule out CVA or other concerning cause of her dizziness.  She voices understanding and would like to be discharged.  Will cancel MRI and discharge her with neurology follow-up and provide her meclizine to keep on hand.   Marita Kansas, PA-C 12/16/22 1617    Horton, Clabe Seal, DO 12/16/22 2340

## 2022-12-17 ENCOUNTER — Other Ambulatory Visit: Payer: Self-pay

## 2022-12-17 ENCOUNTER — Other Ambulatory Visit: Payer: Self-pay | Admitting: Nurse Practitioner

## 2022-12-17 ENCOUNTER — Ambulatory Visit: Payer: Self-pay | Attending: Nurse Practitioner

## 2022-12-17 DIAGNOSIS — Z23 Encounter for immunization: Secondary | ICD-10-CM | POA: Insufficient documentation

## 2022-12-17 DIAGNOSIS — Z3041 Encounter for surveillance of contraceptive pills: Secondary | ICD-10-CM

## 2022-12-17 NOTE — Addendum Note (Signed)
Addended by: Elsie Lincoln F on: 12/17/2022 02:27 PM   Modules accepted: Level of Service

## 2022-12-17 NOTE — Progress Notes (Signed)
Flu vaccine administered in per protocols.  Information sheet given. Patient denies and pain or discomfort at injection site. Tolerated injection well no reaction.

## 2022-12-18 ENCOUNTER — Other Ambulatory Visit: Payer: Self-pay

## 2022-12-18 MED ORDER — LEVONORGESTREL-ETHINYL ESTRAD 0.15-30 MG-MCG PO TABS
1.0000 | ORAL_TABLET | Freq: Every day | ORAL | 1 refills | Status: DC
  Filled 2022-12-18: qty 84, 84d supply, fill #0
  Filled 2023-01-12: qty 56, 56d supply, fill #0
  Filled 2023-03-15: qty 56, 56d supply, fill #1

## 2022-12-18 NOTE — Telephone Encounter (Signed)
Requested Prescriptions  Pending Prescriptions Disp Refills   levonorgestrel-ethinyl estradiol (ALTAVERA) 0.15-30 MG-MCG tablet 84 tablet 1    Sig: Take 1 tablet by mouth daily.     OB/GYN:  Contraceptives Passed - 12/17/2022 11:05 AM      Passed - Last BP in normal range    BP Readings from Last 1 Encounters:  12/16/22 (!) 105/58         Passed - Valid encounter within last 12 months    Recent Outpatient Visits           5 months ago Encounter for Papanicolaou smear for cervical cancer screening   [facility] [place], Shea Stakes, NP   1 year ago Lateral epicondylitis of both elbows   [facility], Shea Stakes, NP   1 year ago Encounter for annual physical exam   [facility] [facility] [place], Shea Stakes, NP   2 years ago Oral contraceptive pill surveillance   [facility] Hoy Register, MD   2 years ago Amenorrhea due to oral contraceptive   [facility], Shea Stakes, [place]              Passed - Patient is not a smoker

## 2023-01-14 ENCOUNTER — Other Ambulatory Visit: Payer: Self-pay

## 2023-01-17 ENCOUNTER — Other Ambulatory Visit: Payer: Self-pay

## 2023-03-14 ENCOUNTER — Other Ambulatory Visit: Payer: Self-pay | Admitting: Nurse Practitioner

## 2023-03-14 DIAGNOSIS — Z3041 Encounter for surveillance of contraceptive pills: Secondary | ICD-10-CM

## 2023-03-14 NOTE — Telephone Encounter (Signed)
Medication Refill -  Most Recent Primary Care Visit:  Provider: Vic Blackbird  Department: [facility]  Visit Type: NURSE VISIT  Date: 12/17/2022  Medication:  levonorgestrel-ethinyl estradiol (ALTAVERA) 0.15-30 MG-MCG tablet  Has the patient contacted their pharmacy? Yes Pt states she needs 2 mo supply to get her through to her appt in   Is this the correct pharmacy for this prescription? Yes If no, delete pharmacy and type the correct one.  This is the patient's preferred pharmacy:  [facility] [address] [place] [address] Phone: [phone] Fax: [phone]   Has the prescription been filled recently? No  Is the patient out of the medication? Yes  Has the patient been seen for an appointment in the last year OR does the patient have an upcoming appointment? Yes  Can we respond through MyChart? No  Agent: Please be advised that Rx refills may take up to 3 business days. We ask that you follow-up with your pharmacy.

## 2023-03-15 ENCOUNTER — Other Ambulatory Visit: Payer: Self-pay

## 2023-03-19 ENCOUNTER — Other Ambulatory Visit: Payer: Self-pay

## 2023-03-19 MED ORDER — LEVONORGESTREL-ETHINYL ESTRAD 0.15-30 MG-MCG PO TABS
1.0000 | ORAL_TABLET | Freq: Every day | ORAL | 1 refills | Status: DC
  Filled 2023-03-19: qty 84, 84d supply, fill #0

## 2023-03-19 NOTE — Telephone Encounter (Signed)
 Requested Prescriptions  Pending Prescriptions Disp Refills   levonorgestrel -ethinyl estradiol  (ALTAVERA ) 0.15-30 MG-MCG tablet 84 tablet 1    Sig: Take 1 tablet by mouth daily.     OB/GYN:  Contraceptives Passed - 03/19/2023 10:10 AM      Passed - Last BP in normal range    BP Readings from Last 1 Encounters:  12/16/22 (!) 105/58         Passed - Valid encounter within last 12 months    Recent Outpatient Visits           8 months ago Encounter for Papanicolaou smear for cervical cancer screening   [facility] [place] Theotis Haze ORN, NP   1 year ago Lateral epicondylitis of both elbows   [place] [facility] [place] Theotis Haze ORN, NP   1 year ago Encounter for annual physical exam   [place] [facility] [place]. [facility] Theotis Haze ORN, NP   2 years ago Oral contraceptive pill surveillance   [facility] [place]. [facility] Delbert Clam, MD   2 years ago Amenorrhea due to oral contraceptive    [facility] [place]. [facility] Theotis Haze ORN, NP       Future Appointments             In 1 month Theotis Haze ORN, NP [facility] [place]. [facility]            Passed - Patient is not a smoker

## 2023-03-21 ENCOUNTER — Other Ambulatory Visit: Payer: Self-pay

## 2023-04-09 ENCOUNTER — Encounter: Payer: Self-pay | Admitting: Neurology

## 2023-04-09 ENCOUNTER — Ambulatory Visit: Payer: Self-pay | Admitting: Neurology

## 2023-04-29 ENCOUNTER — Encounter: Payer: Self-pay | Admitting: Nurse Practitioner

## 2023-04-29 ENCOUNTER — Other Ambulatory Visit: Payer: Self-pay

## 2023-04-29 ENCOUNTER — Ambulatory Visit: Payer: Self-pay | Attending: Nurse Practitioner | Admitting: Nurse Practitioner

## 2023-04-29 VITALS — BP 115/73 | HR 76 | Resp 19 | Ht 61.0 in | Wt 154.4 lb

## 2023-04-29 DIAGNOSIS — R42 Dizziness and giddiness: Secondary | ICD-10-CM

## 2023-04-29 DIAGNOSIS — R7309 Other abnormal glucose: Secondary | ICD-10-CM

## 2023-04-29 DIAGNOSIS — E559 Vitamin D deficiency, unspecified: Secondary | ICD-10-CM

## 2023-04-29 DIAGNOSIS — R748 Abnormal levels of other serum enzymes: Secondary | ICD-10-CM

## 2023-04-29 DIAGNOSIS — Z3041 Encounter for surveillance of contraceptive pills: Secondary | ICD-10-CM

## 2023-04-29 MED ORDER — VITAMIN D (ERGOCALCIFEROL) 1.25 MG (50000 UNIT) PO CAPS
50000.0000 [IU] | ORAL_CAPSULE | ORAL | 0 refills | Status: AC
  Filled 2023-04-29: qty 12, 84d supply, fill #0

## 2023-04-29 MED ORDER — LEVONORGESTREL-ETHINYL ESTRAD 0.15-30 MG-MCG PO TABS
1.0000 | ORAL_TABLET | Freq: Every day | ORAL | 1 refills | Status: DC
  Filled 2023-04-29 – 2023-06-10 (×2): qty 84, 84d supply, fill #0
  Filled 2023-08-23: qty 84, 84d supply, fill #1

## 2023-04-29 MED ORDER — MECLIZINE HCL 25 MG PO TABS
25.0000 mg | ORAL_TABLET | Freq: Three times a day (TID) | ORAL | 3 refills | Status: AC | PRN
  Filled 2023-04-29: qty 30, 10d supply, fill #0

## 2023-04-29 NOTE — Progress Notes (Signed)
Assessment & Plan:  [name] was seen today for dizziness.  Diagnoses and all orders for this visit:  Dizziness Patient was urged to apply for the financial assistance program.  They were instructed to inquire at the front desk about the application process for the [place] discount, orange card or other financial assistance.     -     Thyroid Panel With TSH -     meclizine (ANTIVERT) 25 MG tablet; Take 1 tablet (25 mg total) by mouth 3 (three) times daily as needed for dizziness.  Vitamin D deficiency disease -     VITAMIN D 25 Hydroxy (Vit-D Deficiency, Fractures) -     Vitamin D, Ergocalciferol, (DRISDOL) 1.25 MG (50000 UNIT) CAPS capsule; Take 1 capsule (50,000 Units total) by mouth every 7 (seven) days.  Elevated liver enzymes -     CMP14+EGFR  Elevated glucose -     Hemoglobin A1c  Encounter for birth control pills maintenance -     levonorgestrel-ethinyl estradiol (ALTAVERA) 0.15-30 MG-MCG tablet; Take 1 tablet by mouth daily.    Patient has been counseled on age-appropriate routine health concerns for screening and prevention. These are reviewed and up-to-date. Referrals have been placed accordingly. Immunizations are up-to-date or declined.    Subjective:   Chief Complaint  Patient presents with   Dizziness    [name] 38 y.o. female presents to office today with c/o dizziness.  She has been experiencing dizziness for several months. Associated symptoms include lightheadedness and near syncope. Dizziness is worse with had movements and does not improve with position changes. She was referred to Neurology last year but states she did not follow up for her appointment as she did not have insurance. Today she reports dizziness as feeling like her head is spinning.   Review of Systems  Constitutional:  Negative for fever, malaise/fatigue and weight loss.  HENT: Negative.  Negative for nosebleeds.   Eyes: Negative.  Negative for blurred vision, double  vision and photophobia.  Respiratory: Negative.  Negative for cough and shortness of breath.   Cardiovascular: Negative.  Negative for chest pain, palpitations and leg swelling.  Gastrointestinal: Negative.  Negative for heartburn, nausea and vomiting.  Musculoskeletal: Negative.  Negative for myalgias.  Neurological:  Positive for dizziness. Negative for focal weakness, seizures and headaches.  Psychiatric/Behavioral: Negative.  Negative for suicidal ideas.      Past Medical History:  Diagnosis Date   Dizziness    Medical history non-contributory     Past Surgical History:  Procedure Laterality Date   NO PAST SURGERIES      Family History  Problem Relation Age of Onset   Alcohol abuse Neg Hx     Social History Reviewed with no changes to be made today.   Outpatient Medications Prior to Visit  Medication Sig Dispense Refill   cyclobenzaprine (FLEXERIL) 10 MG tablet Take 1 tablet (10 mg total) by mouth 2 (two) times daily as needed for muscle spasms. 60 tablet 1   levonorgestrel-ethinyl estradiol (ALTAVERA) 0.15-30 MG-MCG tablet Take 1 tablet by mouth daily. 84 tablet 1   meclizine (ANTIVERT) 25 MG tablet Take 1 tablet (25 mg total) by mouth 3 (three) times daily as needed for dizziness. 30 tablet 0   Vitamin D, Ergocalciferol, (DRISDOL) 1.25 MG (50000 UNIT) CAPS capsule Take 1 capsule (50,000 Units total) by mouth every 7 (seven) days. (Patient not taking: Reported on 04/29/2023) 12 capsule 1   No facility-administered medications prior to visit.  No Known Allergies     Objective:    BP 115/73 (BP Location: Left Arm, Patient Position: Sitting, Cuff Size: Normal)   Pulse 76   Resp 19   Ht 5\' 1"  (1.549 m)   Wt 154 lb 6.4 oz (70 kg)   LMP  (LMP Unknown)   SpO2 100%   Breastfeeding No   BMI 29.17 kg/m  Wt Readings from Last 3 Encounters:  04/29/23 154 lb 6.4 oz (70 kg)  12/16/22 154 lb (69.9 kg)  06/26/22 154 lb 9.6 oz (70.1 kg)    Physical Exam Vitals and  nursing note reviewed.  Constitutional:      Appearance: She is well-developed.  HENT:     Head: Normocephalic and atraumatic.  Cardiovascular:     Rate and Rhythm: Normal rate and regular rhythm.     Heart sounds: Normal heart sounds. No murmur heard.    No friction rub. No gallop.  Pulmonary:     Effort: Pulmonary effort is normal. No tachypnea or respiratory distress.     Breath sounds: Normal breath sounds. No decreased breath sounds, wheezing, rhonchi or rales.  Chest:     Chest wall: No tenderness.  Abdominal:     General: Bowel sounds are normal.     Palpations: Abdomen is soft.  Musculoskeletal:        General: Normal range of motion.     Cervical back: Normal range of motion.  Skin:    General: Skin is warm and dry.  Neurological:     Mental Status: She is alert and oriented to person, place, and time.     Coordination: Coordination normal.  Psychiatric:        Behavior: Behavior normal. Behavior is cooperative.        Thought Content: Thought content normal.        Judgment: Judgment normal.          Patient has been counseled extensively about nutrition and exercise as well as the importance of adherence with medications and regular follow-up. The patient was given clear instructions to go to ER or return to medical center if symptoms don't improve, worsen or new problems develop. The patient verbalized understanding.   Follow-up: Return if symptoms worsen or fail to improve.   Claiborne Rigg, FNP-BC [facility] [place], [place] [phone]   04/30/2023, 9:36 PM

## 2023-04-30 ENCOUNTER — Encounter: Payer: Self-pay | Admitting: Nurse Practitioner

## 2023-04-30 LAB — CMP14+EGFR
ALT: 123 [IU]/L — ABNORMAL HIGH (ref 0–32)
AST: 74 [IU]/L — ABNORMAL HIGH (ref 0–40)
Albumin: 4.3 g/dL (ref 3.9–4.9)
Alkaline Phosphatase: 71 [IU]/L (ref 44–121)
BUN/Creatinine Ratio: 12 (ref 9–23)
BUN: 9 mg/dL (ref 6–20)
Bilirubin Total: 0.3 mg/dL (ref 0.0–1.2)
CO2: 21 mmol/L (ref 20–29)
Calcium: 9.6 mg/dL (ref 8.7–10.2)
Chloride: 102 mmol/L (ref 96–106)
Creatinine, Ser: 0.76 mg/dL (ref 0.57–1.00)
Globulin, Total: 2.9 g/dL (ref 1.5–4.5)
Glucose: 112 mg/dL — ABNORMAL HIGH (ref 70–99)
Potassium: 4.2 mmol/L (ref 3.5–5.2)
Sodium: 138 mmol/L (ref 134–144)
Total Protein: 7.2 g/dL (ref 6.0–8.5)
eGFR: 103 mL/min/{1.73_m2} (ref >59)

## 2023-04-30 LAB — THYROID PANEL WITH TSH
Free Thyroxine Index: 1.8 (ref 1.2–4.9)
T3 Uptake Ratio: 15 % — ABNORMAL LOW (ref 24–39)
T4, Total: 12 ug/dL (ref 4.5–12.0)
TSH: 1.54 u[IU]/mL (ref 0.450–4.500)

## 2023-04-30 LAB — HEMOGLOBIN A1C
Est. average glucose Bld gHb Est-mCnc: 117 mg/dL
Hgb A1c MFr Bld: 5.7 % — ABNORMAL HIGH (ref 4.8–5.6)

## 2023-04-30 LAB — VITAMIN D 25 HYDROXY (VIT D DEFICIENCY, FRACTURES): Vit D, 25-Hydroxy: 22.4 ng/mL — ABNORMAL LOW (ref 30.0–100.0)

## 2023-05-03 ENCOUNTER — Ambulatory Visit: Payer: Self-pay | Attending: Nurse Practitioner

## 2023-05-03 DIAGNOSIS — R768 Other specified abnormal immunological findings in serum: Secondary | ICD-10-CM

## 2023-05-03 DIAGNOSIS — R7989 Other specified abnormal findings of blood chemistry: Secondary | ICD-10-CM

## 2023-05-03 DIAGNOSIS — R42 Dizziness and giddiness: Secondary | ICD-10-CM

## 2023-05-03 DIAGNOSIS — R748 Abnormal levels of other serum enzymes: Secondary | ICD-10-CM

## 2023-05-04 LAB — AUTOIMMUNE PROFILE
Anti Nuclear Antibody (ANA): POSITIVE — AB
Complement C3, Serum: 187 mg/dL — ABNORMAL HIGH (ref 82–167)
dsDNA Ab: <1 [IU]/mL (ref 0–9)

## 2023-05-06 ENCOUNTER — Ambulatory Visit: Payer: Self-pay | Admitting: Nurse Practitioner

## 2023-05-06 ENCOUNTER — Telehealth: Payer: Self-pay

## 2023-05-06 ENCOUNTER — Telehealth: Payer: Self-pay | Admitting: Nurse Practitioner

## 2023-05-06 NOTE — Telephone Encounter (Signed)
Return call to patient unanswered.

## 2023-05-06 NOTE — Telephone Encounter (Signed)
 Copied from CRM [id number]. Topic: Clinical - Lab/Test Results >> May 06, 2023  2:21 PM Yolanda T wrote: Reason for CRM: advised patient of her results but she wanted to know what the provider meant by her autoimmune profile was adnormal. Please f/u with patient

## 2023-05-06 NOTE — Telephone Encounter (Signed)
 See NT noted

## 2023-05-06 NOTE — Telephone Encounter (Signed)
 Copied from CRM [id number]. Topic: Clinical - Lab/Test Results >> May 06, 2023  3:27 PM Payton Doughty wrote: Reason for CRM: pt given lab results and would like to speak w/ a nurse for further explanation

## 2023-05-06 NOTE — Telephone Encounter (Signed)
 Answer Assessment - Initial Assessment Questions 1. REASON FOR CALL or QUESTION: "What is your reason for calling today?" or "How can I best help you?" or "What question do you have that I can help answer?"     Patient calling asking why she was referred to Rheumatology. This RN read lab results for patient on autoimmune panel and PCP's suggestion to be referred to Rheumatology for further workup. Patient verbalized understanding. No further questions at this time.  Protocols used: Information Only Call - No Triage-A-AH

## 2023-05-06 NOTE — Telephone Encounter (Signed)
 noted

## 2023-06-10 ENCOUNTER — Other Ambulatory Visit: Payer: Self-pay

## 2023-06-11 ENCOUNTER — Other Ambulatory Visit: Payer: Self-pay

## 2023-08-23 ENCOUNTER — Other Ambulatory Visit: Payer: Self-pay

## 2023-08-27 ENCOUNTER — Other Ambulatory Visit: Payer: Self-pay | Admitting: Nurse Practitioner

## 2023-08-27 ENCOUNTER — Other Ambulatory Visit: Payer: Self-pay

## 2023-08-27 DIAGNOSIS — Z3041 Encounter for surveillance of contraceptive pills: Secondary | ICD-10-CM

## 2023-08-28 ENCOUNTER — Other Ambulatory Visit: Payer: Self-pay

## 2023-08-28 MED ORDER — LEVONORGESTREL-ETHINYL ESTRAD 0.15-30 MG-MCG PO TABS
1.0000 | ORAL_TABLET | Freq: Every day | ORAL | 0 refills | Status: DC
  Filled 2023-08-28 – 2023-11-06 (×2): qty 28, 28d supply, fill #0

## 2023-09-27 ENCOUNTER — Encounter: Payer: Self-pay | Admitting: Internal Medicine

## 2023-09-27 ENCOUNTER — Ambulatory Visit: Payer: Self-pay | Attending: Internal Medicine | Admitting: Internal Medicine

## 2023-09-27 ENCOUNTER — Other Ambulatory Visit: Payer: Self-pay

## 2023-09-27 VITALS — BP 121/73 | HR 66 | Resp 14 | Ht 62.0 in | Wt 154.0 lb

## 2023-09-27 DIAGNOSIS — M7652 Patellar tendinitis, left knee: Secondary | ICD-10-CM

## 2023-09-27 DIAGNOSIS — M7651 Patellar tendinitis, right knee: Secondary | ICD-10-CM

## 2023-09-27 DIAGNOSIS — R748 Abnormal levels of other serum enzymes: Secondary | ICD-10-CM

## 2023-09-27 DIAGNOSIS — M21611 Bunion of right foot: Secondary | ICD-10-CM | POA: Diagnosis not present

## 2023-09-27 DIAGNOSIS — R768 Other specified abnormal immunological findings in serum: Secondary | ICD-10-CM | POA: Diagnosis not present

## 2023-09-27 DIAGNOSIS — G5601 Carpal tunnel syndrome, right upper limb: Secondary | ICD-10-CM

## 2023-09-27 MED ORDER — MELOXICAM 15 MG PO TABS
15.0000 mg | ORAL_TABLET | Freq: Every day | ORAL | 0 refills | Status: AC | PRN
  Filled 2023-09-27: qty 30, 30d supply, fill #0

## 2023-09-27 NOTE — Progress Notes (Signed)
 Office Visit Note  Patient: [name]             Date of Birth: 11/29/1985           MRN: [id number]             PCP: Theotis Haze ORN, NP Referring: Theotis Haze ORN, NP Visit Date: 09/27/2023   Subjective:  New Patient (Initial Visit) (Patient states [name] has pain in her knees and elbows. Patient states the pain is worse at night. Patient states [name] does have pain and swelling in her right foot and ankle. )   Discussed the use of AI scribe software for clinical note transcription with the patient, who gave verbal consent to proceed.  History of Present Illness   [name] is a 38 year old female who presents with joint pain and abnormal liver enzyme tests. [name] was referred by another doctor due to abnormal test results, including elevated liver enzymes and a positive ANA test.  [name] has been experiencing joint pain for more than five months, particularly affecting her elbow, knee, and leg. The pain is more pronounced at night and when standing or climbing stairs. 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 [name] describes the pain as 'purple and sometimes swelling,' and notes that it has been ongoing for years but has become more uncomfortable recently. [name] uses ibuprofen  for pain relief, but finds it not very effective.  [name] experiences swelling in her hands, particularly when cooking or using a knife, which causes her hands to swell within five minutes. No circulation problems such as changes in color of her fingers or toes are noted. [name] experiences numbness in her hands, particularly at night, which sometimes wakes her up. No dropping things or needing to shake her hands to relieve the numbness.  [name] reports experiencing skin rashes that are red and slightly swollen, which have been present for about a month. No mouth ulcers or sores.  [name] denies any significant alcohol consumption, stating [name] only drinks wine occasionally on weekends.       Activities of Daily Living:  Patient reports morning stiffness for 5 minutes.   Patient Reports nocturnal pain.  Difficulty dressing/grooming: Denies Difficulty climbing stairs: Reports Difficulty getting out of chair: Reports Difficulty using hands for taps, buttons, cutlery, and/or writing: Reports  Review of Systems  Constitutional:  Positive for fatigue.  HENT:  Negative for mouth sores and mouth dryness.   Eyes:  Positive for dryness.  Respiratory:  Negative for shortness of breath.   Cardiovascular:  Positive for chest pain. Negative for palpitations.  Gastrointestinal:  Negative for blood in stool, constipation and diarrhea.  Endocrine: Negative for increased urination.  Genitourinary:  Negative for involuntary urination.  Musculoskeletal:  Positive for joint pain, joint pain, joint swelling, myalgias, muscle weakness, morning stiffness, muscle tenderness and myalgias. Negative for gait problem.  Skin:  Positive for rash. Negative for color change, hair loss and sensitivity to sunlight.  Allergic/Immunologic: Negative for susceptible to infections.  Neurological:  Positive for dizziness and headaches.  Hematological:  Positive for swollen glands.  Psychiatric/Behavioral:  Negative for depressed mood and sleep disturbance. The patient is not nervous/anxious.     PMFS History:  Patient Active Problem List   Diagnosis Date Noted   Positive ANA (antinuclear antibody) 09/27/2023   Patellar tendonitis of both knees 09/27/2023   Carpal tunnel syndrome, right upper limb 09/27/2023   Bunion of great toe of right foot 09/27/2023  Endometriosis determined by laparoscopy 02/13/2017   Pregnancy 12/26/2013   NVD (normal vaginal delivery) 12/26/2013   Fall 11/27/2013    Past Medical History:  Diagnosis Date   Dizziness    Medical history non-contributory     Family History  Problem Relation Age of Onset   Alcohol abuse Neg Hx    Past Surgical History:  Procedure Laterality  Date   NO PAST SURGERIES     Social History   Social History Narrative   Not on file   Immunization History  Administered Date(s) Administered   Influenza, Seasonal, Injecte, Preservative Fre 12/17/2022   Influenza,inj,Quad PF,6+ Mos 12/27/2013, 02/04/2019, 02/18/2020, 06/09/2021, 03/21/2022   Tdap 12/27/2013     Objective: Vital Signs: BP 121/73 (BP Location: Right Arm, Patient Position: Sitting, Cuff Size: Normal)   Pulse 66   Resp 14   Ht 5' 2 (1.575 m)   Wt 154 lb (69.9 kg)   LMP 09/27/2023   BMI 28.17 kg/m    Physical Exam Eyes:     Conjunctiva/sclera: Conjunctivae normal.  Cardiovascular:     Rate and Rhythm: Normal rate and regular rhythm.  Pulmonary:     Effort: Pulmonary effort is normal.     Breath sounds: Normal breath sounds.  Musculoskeletal:     Right lower leg: No edema.     Left lower leg: No edema.  Lymphadenopathy:     Cervical: No cervical adenopathy.  Skin:    General: Skin is warm and dry.     Findings: No rash.  Neurological:     Mental Status: [name] is alert.  Psychiatric:        Mood and Affect: Mood normal.      Musculoskeletal Exam:  Shoulders full range of motion, some tenderness to pressure throughout upper back and shoulder muscles Elbows full ROM no tenderness or swelling Wrists full ROM no tenderness or swelling Fingers full ROM no tenderness or swelling Positive pain provoked with Tinel and Phalen signs at wrist right worse than left Knees full ROM anterior tenderness to pressure worst at patellar tendon, no palpable effusions Ankles full ROM no tenderness or swelling   Investigation: No additional findings.  Imaging: No results found.  Recent Labs: Lab Results  Component Value Date   WBC 8.4 09/27/2023   HGB 15.0 09/27/2023   PLT 363 09/27/2023   NA 139 09/27/2023   K 4.6 09/27/2023   CL 105 09/27/2023   CO2 22 09/27/2023   GLUCOSE 91 09/27/2023   BUN 12 09/27/2023   CREATININE 0.89 09/27/2023   BILITOT 0.4  09/27/2023   ALKPHOS 71 04/29/2023   AST 133 (H) 09/27/2023   ALT 228 (H) 09/27/2023   PROT 7.9 09/27/2023   ALBUMIN 4.3 04/29/2023   CALCIUM 9.2 09/27/2023   GFRAA 107 04/13/2020    Speciality Comments: No specialty comments available.  Procedures:  No procedures performed Allergies: Patient has no known allergies.   Assessment / Plan:     Visit Diagnoses: Positive ANA (antinuclear antibody) - Plan: RNP Antibody, Anti-Smith antibody, Sjogrens syndrome-A extractable nuclear antibody, Sjogrens syndrome-B extractable nuclear antibody, Anti-DNA antibody, double-stranded, C3 and C4, Sedimentation rate, C-reactive protein Abnormal liver enzymes - Plan: CK, CBC with Differential/Platelet, Comprehensive metabolic panel with GFR, Anti-smooth muscle antibody, IgG Elevated liver enzymes approximately two times normal with no clear etiology. Potential causes include muscle inflammation or autoimmune conditions. Positive ANA test, non-specific, requires further investigation.  No definite synovitis or dactylitis of peripheral joints on exam.  No other  specific clinical criteria evident on exam and history at this time.  Would consider possibilities including seronegative RA, Sjogren's, undifferentiated connective tissue disease if labs are positive. - Order blood tests: repeat AST, ALT, CK, sedimentation rate, CRP, complement C3 and C4. - Meloxicam  15 mg once daily as needed - Advise against excessive alcohol consumption.  Carpal tunnel syndrome Right hand symptoms appear worse than left.  No red flags of motor weakness or thenar muscle atrophy.  If there is evidence of inflammatory disease on workup impingement may improve with treating this.  For now conservative management including NSAIDs splinting and exercises. - Provide hand exercises. - Recommend wearing a wrist brace at night.  Patellar tendonitis Patellar tendonitis causing knee pain, likely due to increased pressure during activities  requiring deep knee bending. - Prescribe oral anti-inflammatory medication once daily. - Advise against taking Aleve while on prescribed medication. - Recommend exercises to improve condition.        Orders: Orders Placed This Encounter  Procedures   RNP Antibody   Anti-Smith antibody   Sjogrens syndrome-A extractable nuclear antibody   Sjogrens syndrome-B extractable nuclear antibody   Anti-DNA antibody, double-stranded   C3 and C4   CK   CBC with Differential/Platelet   Comprehensive metabolic panel with GFR   Sedimentation rate   C-reactive protein   Anti-smooth muscle antibody, IgG   Meds ordered this encounter  Medications   meloxicam  (MOBIC ) 15 MG tablet    Sig: Take 1 tablet (15 mg total) by mouth daily as needed for pain.    Dispense:  30 tablet    Refill:  0     Follow-Up Instructions: No follow-ups on file.   Lonni LELON Ester, MD  Note - This record has been created using [employer].  Chart creation errors have been sought, but may not always  have been located. Such creation errors do not reflect on  the standard of medical care.

## 2023-10-01 LAB — CBC WITH DIFFERENTIAL/PLATELET
Absolute Lymphocytes: 2738 {cells}/uL (ref 850–3900)
Absolute Monocytes: 420 {cells}/uL (ref 200–950)
Basophils Absolute: 50 {cells}/uL (ref 0–200)
Basophils Relative: 0.6 %
Eosinophils Absolute: 92 {cells}/uL (ref 15–500)
Eosinophils Relative: 1.1 %
HCT: 46.8 % — ABNORMAL HIGH (ref 35.0–45.0)
Hemoglobin: 15 g/dL (ref 11.7–15.5)
MCH: 29 pg (ref 27.0–33.0)
MCHC: 32.1 g/dL (ref 32.0–36.0)
MCV: 90.5 fL (ref 80.0–100.0)
MPV: 10.7 fL (ref 7.5–12.5)
Monocytes Relative: 5 %
Neutro Abs: 5099 {cells}/uL (ref 1500–7800)
Neutrophils Relative %: 60.7 %
Platelets: 363 Thousand/uL (ref 140–400)
RBC: 5.17 Million/uL — ABNORMAL HIGH (ref 3.80–5.10)
RDW: 13.4 % (ref 11.0–15.0)
Total Lymphocyte: 32.6 %
WBC: 8.4 Thousand/uL (ref 3.8–10.8)

## 2023-10-01 LAB — CK: Total CK: 127 U/L (ref 20–239)

## 2023-10-01 LAB — COMPREHENSIVE METABOLIC PANEL WITH GFR
AG Ratio: 1.5 (calc) (ref 1.0–2.5)
ALT: 228 U/L — ABNORMAL HIGH (ref 6–29)
AST: 133 U/L — ABNORMAL HIGH (ref 10–30)
Albumin: 4.7 g/dL (ref 3.6–5.1)
Alkaline phosphatase (APISO): 62 U/L (ref 31–125)
BUN: 12 mg/dL (ref 7–25)
CO2: 22 mmol/L (ref 20–32)
Calcium: 9.2 mg/dL (ref 8.6–10.2)
Chloride: 105 mmol/L (ref 98–110)
Creat: 0.89 mg/dL (ref 0.50–0.97)
Globulin: 3.2 g/dL (ref 1.9–3.7)
Glucose, Bld: 91 mg/dL (ref 65–99)
Potassium: 4.6 mmol/L (ref 3.5–5.3)
Sodium: 139 mmol/L (ref 135–146)
Total Bilirubin: 0.4 mg/dL (ref 0.2–1.2)
Total Protein: 7.9 g/dL (ref 6.1–8.1)
eGFR: 85 mL/min/1.73m2 (ref >=60)

## 2023-10-01 LAB — SJOGRENS SYNDROME-B EXTRACTABLE NUCLEAR ANTIBODY: SSB (La) (ENA) Antibody, IgG: 1.6 AI — AB

## 2023-10-01 LAB — ANTI-SMOOTH MUSCLE ANTIBODY, IGG: Actin (Smooth Muscle) Antibody (IGG): <20 U (ref <20)

## 2023-10-01 LAB — ANTI-DNA ANTIBODY, DOUBLE-STRANDED: ds DNA Ab: <1 [IU]/mL

## 2023-10-01 LAB — ANTI-SMITH ANTIBODY: ENA SM Ab Ser-aCnc: <1 AI

## 2023-10-01 LAB — C3 AND C4
C3 Complement: 250 mg/dL — ABNORMAL HIGH (ref 83–193)
C4 Complement: 45 mg/dL (ref 15–57)

## 2023-10-01 LAB — RNP ANTIBODY: Ribonucleic Protein(ENA) Antibody, IgG: <1 AI

## 2023-10-01 LAB — C-REACTIVE PROTEIN: CRP: 7.9 mg/L (ref <8.0)

## 2023-10-01 LAB — SJOGRENS SYNDROME-A EXTRACTABLE NUCLEAR ANTIBODY: SSA (Ro) (ENA) Antibody, IgG: <1 AI

## 2023-10-01 LAB — SEDIMENTATION RATE: Sed Rate: 29 mm/h — ABNORMAL HIGH (ref 0–20)

## 2023-10-29 ENCOUNTER — Telehealth: Payer: Self-pay

## 2023-10-29 NOTE — Telephone Encounter (Signed)
 Please see previous phone not for details.

## 2023-10-29 NOTE — Telephone Encounter (Signed)
 Patient left a VM stating she would like a call back about her labs

## 2023-10-29 NOTE — Telephone Encounter (Signed)
 Called patient to advise that her labs will be discussed at her F/U up 11/06/2023

## 2023-11-06 ENCOUNTER — Ambulatory Visit: Attending: Internal Medicine | Admitting: Internal Medicine

## 2023-11-06 ENCOUNTER — Encounter: Payer: Self-pay | Admitting: Internal Medicine

## 2023-11-06 ENCOUNTER — Other Ambulatory Visit: Payer: Self-pay

## 2023-11-06 ENCOUNTER — Other Ambulatory Visit: Payer: Self-pay | Admitting: Family Medicine

## 2023-11-06 VITALS — BP 110/71 | HR 79 | Resp 16 | Ht 61.0 in | Wt 156.7 lb

## 2023-11-06 DIAGNOSIS — R768 Other specified abnormal immunological findings in serum: Secondary | ICD-10-CM

## 2023-11-06 DIAGNOSIS — R748 Abnormal levels of other serum enzymes: Secondary | ICD-10-CM

## 2023-11-06 DIAGNOSIS — R109 Unspecified abdominal pain: Secondary | ICD-10-CM

## 2023-11-06 NOTE — Progress Notes (Signed)
 Office Visit Note  Patient: [name]             Date of Birth: 05-11-1985           MRN: [id number]             PCP: Theotis Haze ORN, NP Referring: Theotis Haze ORN, NP Visit Date: 11/06/2023   Subjective:  Pain of the Right Foot and Follow-up (Discuss labs)   Discussed the use of AI scribe software for clinical note transcription with the patient, who gave verbal consent to proceed.  History of Present Illness   [name] is a 38 year old female who presents with elevated liver enzymes and inflammation markers.  She has experienced elevated liver enzyme levels, specifically AST and ALT, over the past year. Initially, the levels were 80 and 140 two years ago, but they have risen to 133 and 228, respectively. No significant alcohol consumption is reported. She avoids anti-inflammatory medications like meloxicam , ibuprofen , and acetaminophen .  She experiences occasional abdominal pain, described as a dull ache radiating from the side to the back. No history of gallstones is reported. The pain is not severe, with no cramping or sharp sensations.  She has frequent headaches and blurry vision, persisting most of the day. Her eyes are watery, with a sensation of dryness. No specific timeline is provided for these symptoms.  No gastrointestinal symptoms such as constipation or diarrhea are reported. No joint swelling is noted, and her joint condition remains unchanged.  Her complement C3 levels have increased from slightly above normal to significantly elevated.       Previous HPI 09/27/23 [name] is a 38 year old female who presents with joint pain and abnormal liver enzyme tests. She was referred by another doctor due to abnormal test results, including elevated liver enzymes and a positive ANA test.   She has been experiencing joint pain for more than five months, particularly affecting her elbow, knee, and leg. The pain is more pronounced at  night and when standing or climbing stairs. 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 She describes the pain as 'purple and sometimes swelling,' and notes that it has been ongoing for years but has become more uncomfortable recently. She uses ibuprofen  for pain relief, but finds it not very effective.   She experiences swelling in her hands, particularly when cooking or using a knife, which causes her hands to swell within five minutes. No circulation problems such as changes in color of her fingers or toes are noted. She experiences numbness in her hands, particularly at night, which sometimes wakes her up. No dropping things or needing to shake her hands to relieve the numbness.   She reports experiencing skin rashes that are red and slightly swollen, which have been present for about a month. No mouth ulcers or sores.   She denies any significant alcohol consumption, stating she only drinks wine occasionally on weekends.    Review of Systems  Constitutional:  Positive for fatigue.  HENT:  Negative for mouth sores and mouth dryness.   Eyes:  Positive for redness and dryness.  Respiratory:  Negative for shortness of breath.   Cardiovascular:  Negative for chest pain and palpitations.  Gastrointestinal:  Negative for blood in stool, constipation and diarrhea.  Endocrine: Negative for increased urination.  Genitourinary:  Negative for involuntary urination.  Musculoskeletal:  Positive for joint pain, joint pain, joint swelling, myalgias, muscle weakness, morning stiffness and myalgias.  Negative for gait problem and muscle tenderness.  Skin:  Negative for color change, rash, hair loss and sensitivity to sunlight.  Allergic/Immunologic: Positive for susceptible to infections.  Neurological:  Positive for dizziness and headaches.  Hematological:  Positive for swollen glands.  Psychiatric/Behavioral:  Negative for depressed mood and sleep disturbance. The patient is  nervous/anxious.     PMFS History:  Patient Active Problem List   Diagnosis Date Noted   Abnormal liver enzymes 11/06/2023   Positive ANA (antinuclear antibody) 09/27/2023   Patellar tendonitis of both knees 09/27/2023   Carpal tunnel syndrome, right upper limb 09/27/2023   Bunion of great toe of right foot 09/27/2023   Endometriosis determined by laparoscopy 02/13/2017   Pregnancy 12/26/2013   NVD (normal vaginal delivery) 12/26/2013   Fall 11/27/2013    Past Medical History:  Diagnosis Date   Dizziness    Medical history non-contributory     Family History  Problem Relation Age of Onset   Alcohol abuse Neg Hx    Past Surgical History:  Procedure Laterality Date   NO PAST SURGERIES     Social History   Social History Narrative   Not on file   Immunization History  Administered Date(s) Administered   Influenza, Seasonal, Injecte, Preservative Fre 12/17/2022, 11/13/2023   Influenza,inj,Quad PF,6+ Mos 12/27/2013, 02/04/2019, 02/18/2020, 06/09/2021, 03/21/2022   Tdap 12/27/2013     Objective: Vital Signs: BP 110/71 (BP Location: Left Arm, Patient Position: Sitting, Cuff Size: Normal)   Pulse 79   Resp 16   Ht 5' 1 (1.549 m)   Wt 156 lb 11.2 oz (71.1 kg)   LMP 10/15/2023 (Approximate)   BMI 29.61 kg/m    Physical Exam Eyes:     Conjunctiva/sclera: Conjunctivae normal.  Cardiovascular:     Rate and Rhythm: Normal rate and regular rhythm.  Pulmonary:     Effort: Pulmonary effort is normal.     Breath sounds: Normal breath sounds.  Abdominal:     Comments: LLQ tenderness to pressure, no rebound or guarding, normal bowel sounds  Musculoskeletal:     Right lower leg: No edema.     Left lower leg: No edema.  Lymphadenopathy:     Cervical: No cervical adenopathy.  Skin:    General: Skin is warm and dry.  Neurological:     Mental Status: She is alert.  Psychiatric:        Mood and Affect: Mood normal.      Musculoskeletal Exam:  Shoulders full range of  motion, some tenderness to pressure throughout upper back and shoulder muscles Elbows full ROM no tenderness or swelling Wrists full ROM no tenderness or swelling Fingers full ROM no tenderness or swelling Positive pain provoked with Tinel and Phalen signs at wrist right worse than left Knees full ROM anterior tenderness to pressure worst at patellar tendon, no palpable effusions Ankles full ROM no tenderness or swelling  Investigation: No additional findings.  Imaging: No results found.  Recent Labs: Lab Results  Component Value Date   WBC 8.4 09/27/2023   HGB 15.0 09/27/2023   PLT 363 09/27/2023   NA 139 09/27/2023   K 4.6 09/27/2023   CL 105 09/27/2023   CO2 22 09/27/2023   GLUCOSE 91 09/27/2023   BUN 12 09/27/2023   CREATININE 0.89 09/27/2023   BILITOT 0.3 11/06/2023   ALKPHOS 71 04/29/2023   AST 42 (H) 11/06/2023   ALT 71 (H) 11/06/2023   PROT 7.3 11/06/2023   ALBUMIN 4.3 04/29/2023  CALCIUM 9.2 09/27/2023   GFRAA 107 04/13/2020    Speciality Comments: No specialty comments available.  Procedures:  No procedures performed Allergies: Patient has no known allergies.   Assessment / Plan:     Visit Diagnoses: Positive ANA (antinuclear antibody)  Joint pain unchanged, no new swelling. No clinical findings on exam or history to indicate underlying systemic connective tissue disease at the time such as lupus or rheumatoid arthritis.     Abnormal liver enzymes - Plan: Hepatic function panel, Anti-smooth muscle antibody, IgG, Mitochondrial antibodies, Hepatitis, Acute Elevated liver enzymes Liver enzymes (AST 133, ALT 228) elevated, indicating possible liver inflammation. Differential includes medication-related causes, metabolic issues, gallbladder stones, or autoimmune liver disease. Elevated complement C3 (250) suggests inflammation and is not typical for SLE. - Order right upper quadrant ultrasound to assess liver and gallbladder. - Order blood tests for autoimmune  liver disease antibodies. - Avoid anti-inflammatory medications like meloxicam , ibuprofen , and acetaminophen .   Orders: Orders Placed This Encounter  Procedures   Hepatic function panel   Anti-smooth muscle antibody, IgG   Mitochondrial antibodies   Hepatitis, Acute   No orders of the defined types were placed in this encounter.    Follow-Up Instructions: Return in about 3 months (around 02/06/2024) for +ANA/liver f/u 3mos.   Lonni LELON Ester, MD  Note - This record has been created using [employer].  Chart creation errors have been sought, but may not always  have been located. Such creation errors do not reflect on  the standard of medical care.

## 2023-11-07 ENCOUNTER — Other Ambulatory Visit: Payer: Self-pay | Admitting: Nurse Practitioner

## 2023-11-07 ENCOUNTER — Other Ambulatory Visit: Payer: Self-pay

## 2023-11-07 DIAGNOSIS — Z3041 Encounter for surveillance of contraceptive pills: Secondary | ICD-10-CM

## 2023-11-07 NOTE — Telephone Encounter (Unsigned)
 Copied from CRM [id number]. Topic: Clinical - Medication Refill >> Nov 07, 2023 11:58 AM Delon T wrote: Medication: levonorgestrel -ethinyl estradiol  (ALTAVERA ) 0.15-30 MG-MCG tablet  Has the patient contacted their pharmacy? Yes (Agent: If no, request that the patient contact the pharmacy for the refill. If patient does not wish to contact the pharmacy document the reason why and proceed with request.) (Agent: If yes, when and what did the pharmacy advise?)  This is the patient's preferred pharmacy:  [facility] [address] [place] [address] Phone: [phone] Fax: [phone]  Is this the correct pharmacy for this prescription? Yes If no, delete pharmacy and type the correct one.   Has the prescription been filled recently? Yes  Is the patient out of the medication? Yes  Has the patient been seen for an appointment in the last year OR does the patient have an upcoming appointment? Yes  Can we respond through MyChart? Yes  Agent: Please be advised that Rx refills may take up to 3 business days. We ask that you follow-up with your pharmacy.

## 2023-11-08 ENCOUNTER — Other Ambulatory Visit: Payer: Self-pay

## 2023-11-08 MED ORDER — LEVONORGESTREL-ETHINYL ESTRAD 0.15-30 MG-MCG PO TABS
1.0000 | ORAL_TABLET | Freq: Every day | ORAL | 0 refills | Status: DC
  Filled 2023-11-08: qty 84, 84d supply, fill #0

## 2023-11-08 NOTE — Telephone Encounter (Signed)
 Requested Prescriptions  Pending Prescriptions Disp Refills   levonorgestrel -ethinyl estradiol  (ALTAVERA ) 0.15-30 MG-MCG tablet 84 tablet 0    Sig: Take 1 tablet by mouth daily. *MAKE OFFICE VISIT*     OB/GYN:  Contraceptives Passed - 11/08/2023 12:39 PM      Passed - Last BP in normal range    BP Readings from Last 1 Encounters:  11/06/23 110/71         Passed - Valid encounter within last 12 months    Recent Outpatient Visits           6 months ago Dizziness   [facility] Of [place] Theotis Haze ORN, NP   1 year ago Encounter for Papanicolaou smear for cervical cancer screening   [facility] Of [place]. [facility] Theotis Haze ORN, NP   2 years ago Lateral epicondylitis of both elbows   [facility] Dept Of [place]. [facility] Theotis Haze ORN, NP   2 years ago Encounter for annual physical exam   [facility] [place] Dept Of [place]. [facility] Theotis Haze ORN, NP   3 years ago Oral contraceptive pill surveillance   [facility] Dept Of [place]. [facility] Delbert Clam, MD       Future Appointments             In 3 months Rice, Lonni ORN, MD [facility] - A Dept Of [place]. [facility]            Passed - Patient is not a smoker

## 2023-11-12 ENCOUNTER — Telehealth: Payer: Self-pay | Admitting: Nurse Practitioner

## 2023-11-12 NOTE — Telephone Encounter (Signed)
 Pt confirmed appt 8/26

## 2023-11-13 ENCOUNTER — Encounter: Payer: Self-pay | Admitting: Nurse Practitioner

## 2023-11-13 ENCOUNTER — Ambulatory Visit: Attending: Nurse Practitioner | Admitting: Nurse Practitioner

## 2023-11-13 ENCOUNTER — Other Ambulatory Visit: Payer: Self-pay

## 2023-11-13 VITALS — BP 129/81 | HR 75 | Resp 19 | Ht 61.0 in | Wt 150.4 lb

## 2023-11-13 DIAGNOSIS — G44201 Tension-type headache, unspecified, intractable: Secondary | ICD-10-CM

## 2023-11-13 DIAGNOSIS — F419 Anxiety disorder, unspecified: Secondary | ICD-10-CM | POA: Diagnosis not present

## 2023-11-13 DIAGNOSIS — Z23 Encounter for immunization: Secondary | ICD-10-CM

## 2023-11-13 DIAGNOSIS — Z3041 Encounter for surveillance of contraceptive pills: Secondary | ICD-10-CM

## 2023-11-13 DIAGNOSIS — N8000 Endometriosis of the uterus, unspecified: Secondary | ICD-10-CM

## 2023-11-13 MED ORDER — LEVONORGESTREL-ETHINYL ESTRAD 0.15-30 MG-MCG PO TABS
1.0000 | ORAL_TABLET | Freq: Every day | ORAL | 3 refills | Status: DC
  Filled 2023-11-13: qty 84, 84d supply, fill #0
  Filled 2024-02-04: qty 84, 84d supply, fill #1

## 2023-11-13 MED ORDER — HYDROXYZINE HCL 10 MG PO TABS
10.0000 mg | ORAL_TABLET | Freq: Three times a day (TID) | ORAL | 0 refills | Status: AC | PRN
  Filled 2023-11-13: qty 60, 20d supply, fill #0

## 2023-11-13 NOTE — Progress Notes (Signed)
 Assessment & Plan:  [name] was seen today for headache.  Diagnoses and all orders for this visit:  Anxiety -     hydrOXYzine  (ATARAX ) 10 MG tablet; Take 1 tablet (10 mg total) by mouth 3 (three) times daily as needed. For anxiety Stress and anxiety due to personal issues causing insomnia. Environmental factors identified. - Prescribe hydroxyzine  up to three times daily for anxiety and sleep. - Follow up in a few weeks to assess hydroxyzine  effectiveness and consider depression treatment if needed.   Need for influenza vaccination -     Flu vaccine trivalent PF, 6mos and older(Flulaval,Afluria,Fluarix,Fluzone)  Encounter for birth control pills maintenance -     levonorgestrel -ethinyl estradiol  (ALTAVERA ) 0.15-30 MG-MCG tablet; Take 1 tablet by mouth daily. Endometriosis Managed with birth control for pain and bleeding control. - Continue birth control prescription for management.   Acute intractable tension-type headache Tension-type headache Headaches suggestive of tension-type, likely stress-related. - Advise meloxicam  daily with food for joint pain or headaches.  Patient has been counseled on age-appropriate routine health concerns for screening and prevention. These are reviewed and up-to-date. Referrals have been placed accordingly. Immunizations are up-to-date or declined.    Subjective:   Chief Complaint  Patient presents with   Headache    History of Present Illness [name] is a 38 year old female who presents with headaches and stress-related symptoms.  She has been experiencing headaches for the past three weeks, occurring from morning to afternoon and lasting for hours. The headaches are primarily located in the occipital region. She reports significant family stress, which she feels may be contributing to her symptoms. She has not been taking meloxicam , which was previously prescribed for pain in her right hand and knees, due to confusion about its  use for headaches.  She is experiencing significant stress due to family dynamics, including conflicts with her husband and concerns about her children's well-being. Her daughter has expressed distress over family issues, which adds to her stress. She reports difficulty sleeping and a significant weight loss from 156 to 150 pounds, attributing it to stress.  She has a history of liver issues and has been advised against taking acetaminophen . She is currently not taking any medication for her headaches or stress. She mentions a past medical history of endometriosis and is on birth control to manage associated symptoms. She is due for a refill of her birth control medication, which she uses to help with endometriosis-related pain.  Review of Systems  Constitutional:  Negative for fever, malaise/fatigue and weight loss.  HENT: Negative.  Negative for nosebleeds.   Eyes: Negative.  Negative for blurred vision, double vision and photophobia.  Respiratory: Negative.  Negative for cough and shortness of breath.   Cardiovascular: Negative.  Negative for chest pain, palpitations and leg swelling.  Gastrointestinal: Negative.  Negative for heartburn, nausea and vomiting.  Musculoskeletal: Negative.  Negative for myalgias.  Neurological: Negative.  Negative for dizziness, focal weakness, seizures and headaches.  Psychiatric/Behavioral:  Positive for depression. Negative for suicidal ideas. The patient is nervous/anxious and has insomnia.     Past Medical History:  Diagnosis Date   Dizziness    Medical history non-contributory     Past Surgical History:  Procedure Laterality Date   NO PAST SURGERIES      Family History  Problem Relation Age of Onset   Alcohol abuse Neg Hx     Social History Reviewed with no changes to be made today.   Outpatient Medications  Prior to Visit  Medication Sig Dispense Refill   cholecalciferol (VITAMIN D3) 25 MCG (1000 UNIT) tablet Take 1,000 Units by mouth  daily.     levonorgestrel -ethinyl estradiol  (ALTAVERA ) 0.15-30 MG-MCG tablet Take 1 tablet by mouth daily. *MAKE OFFICE VISIT* 84 tablet 0   cyclobenzaprine  (FLEXERIL ) 10 MG tablet Take 1 tablet (10 mg total) by mouth 2 (two) times daily as needed for muscle spasms. (Patient not taking: Reported on 11/13/2023) 60 tablet 1   meclizine  (ANTIVERT ) 25 MG tablet Take 1 tablet (25 mg total) by mouth 3 (three) times daily as needed for dizziness. (Patient not taking: Reported on 11/13/2023) 30 tablet 3   meloxicam  (MOBIC ) 15 MG tablet Take 1 tablet (15 mg total) by mouth daily as needed for pain. (Patient not taking: Reported on 11/13/2023) 30 tablet 0   Vitamin D , Ergocalciferol , (DRISDOL ) 1.25 MG (50000 UNIT) CAPS capsule Take 1 capsule (50,000 Units total) by mouth every 7 (seven) days. (Patient not taking: Reported on 11/13/2023) 12 capsule 0   No facility-administered medications prior to visit.    No Known Allergies     Objective:    BP 129/81 (BP Location: Left Arm, Patient Position: Sitting, Cuff Size: Normal)   Pulse 75   Resp 19   Ht 5' 1 (1.549 m)   Wt 150 lb 6.4 oz (68.2 kg)   LMP 10/15/2023 (Approximate)   SpO2 100%   BMI 28.42 kg/m  Wt Readings from Last 3 Encounters:  11/13/23 150 lb 6.4 oz (68.2 kg)  11/06/23 156 lb 11.2 oz (71.1 kg)  09/27/23 154 lb (69.9 kg)    Physical Exam Vitals and nursing note reviewed.  Constitutional:      Appearance: She is well-developed.  HENT:     Head: Normocephalic and atraumatic.  Cardiovascular:     Rate and Rhythm: Normal rate and regular rhythm.     Heart sounds: Normal heart sounds. No murmur heard.    No friction rub. No gallop.  Pulmonary:     Effort: Pulmonary effort is normal. No tachypnea or respiratory distress.     Breath sounds: Normal breath sounds. No decreased breath sounds, wheezing, rhonchi or rales.  Chest:     Chest wall: No tenderness.  Abdominal:     General: Bowel sounds are normal.     Palpations: Abdomen is  soft.  Musculoskeletal:        General: Normal range of motion.     Cervical back: Normal range of motion.  Skin:    General: Skin is warm and dry.  Neurological:     Mental Status: She is alert and oriented to person, place, and time.     Coordination: Coordination normal.  Psychiatric:        Mood and Affect: Affect is tearful.        Behavior: Behavior normal. Behavior is cooperative.        Thought Content: Thought content normal.        Judgment: Judgment normal.          Patient has been counseled extensively about nutrition and exercise as well as the importance of adherence with medications and regular follow-up. The patient was given clear instructions to go to ER or return to medical center if symptoms don't improve, worsen or new problems develop. The patient verbalized understanding.   Follow-up: Return in about 3 weeks (around 12/04/2023) for anxiety.   Toni LELON Servant, FNP-BC [facility] and [facility] [place], [place] [phone]  11/13/2023, 12:49 PM

## 2023-11-14 ENCOUNTER — Ambulatory Visit: Payer: Self-pay | Admitting: Internal Medicine

## 2023-11-14 DIAGNOSIS — R748 Abnormal levels of other serum enzymes: Secondary | ICD-10-CM

## 2023-11-14 LAB — HEPATIC FUNCTION PANEL
AG Ratio: 1.4 (calc) (ref 1.0–2.5)
ALT: 71 U/L — ABNORMAL HIGH (ref 6–29)
AST: 42 U/L — ABNORMAL HIGH (ref 10–30)
Albumin: 4.2 g/dL (ref 3.6–5.1)
Alkaline phosphatase (APISO): 58 U/L (ref 31–125)
Bilirubin, Direct: 0.1 mg/dL (ref 0.0–0.2)
Globulin: 3.1 g/dL (ref 1.9–3.7)
Indirect Bilirubin: 0.2 mg/dL (ref 0.2–1.2)
Total Bilirubin: 0.3 mg/dL (ref 0.2–1.2)
Total Protein: 7.3 g/dL (ref 6.1–8.1)

## 2023-11-14 LAB — HEPATITIS PANEL, ACUTE
Hep A IgM: NONREACTIVE
Hep B C IgM: NONREACTIVE
Hepatitis B Surface Ag: NONREACTIVE
Hepatitis C Ab: NONREACTIVE

## 2023-11-14 LAB — ANTI-SMOOTH MUSCLE ANTIBODY, IGG: Actin (Smooth Muscle) Antibody (IGG): <20 U (ref <20)

## 2023-11-14 LAB — MITOCHONDRIAL ANTIBODIES: Mitochondrial M2 Ab, IgG: <=20 U (ref <=20.0)

## 2023-11-14 NOTE — Progress Notes (Signed)
 Blood tests look much better although still abnormal.  The AST decreased to 42 from 133 and the ALT decreased to 71 from 228.  Her blood tests were negative for viral hepatitis nor for autoimmune hepatitis.  This is pretty reassuring but l recommend we get her scheduled for a right upper quadrant ultrasound to look for any liver or gallbladder changes.

## 2023-11-21 ENCOUNTER — Other Ambulatory Visit: Payer: Self-pay

## 2023-11-27 ENCOUNTER — Ambulatory Visit (HOSPITAL_BASED_OUTPATIENT_CLINIC_OR_DEPARTMENT_OTHER)
Admission: RE | Admit: 2023-11-27 | Discharge: 2023-11-27 | Disposition: A | Discharge status: Home / Self Care | Payer: Self-pay | Source: Ambulatory Visit | Attending: Internal Medicine | Admitting: Internal Medicine

## 2023-11-27 DIAGNOSIS — R748 Abnormal levels of other serum enzymes: Secondary | ICD-10-CM | POA: Insufficient documentation

## 2023-12-09 ENCOUNTER — Ambulatory Visit: Payer: Self-pay | Attending: Nurse Practitioner | Admitting: Nurse Practitioner

## 2023-12-09 ENCOUNTER — Encounter: Payer: Self-pay | Admitting: Nurse Practitioner

## 2023-12-09 VITALS — BP 105/69 | HR 63 | Resp 19 | Ht 61.0 in | Wt 153.4 lb

## 2023-12-09 DIAGNOSIS — R945 Abnormal results of liver function studies: Secondary | ICD-10-CM

## 2023-12-09 DIAGNOSIS — R350 Frequency of micturition: Secondary | ICD-10-CM

## 2023-12-09 DIAGNOSIS — Z79899 Other long term (current) drug therapy: Secondary | ICD-10-CM

## 2023-12-09 DIAGNOSIS — R7989 Other specified abnormal findings of blood chemistry: Secondary | ICD-10-CM

## 2023-12-09 DIAGNOSIS — F419 Anxiety disorder, unspecified: Secondary | ICD-10-CM

## 2023-12-09 DIAGNOSIS — R7303 Prediabetes: Secondary | ICD-10-CM

## 2023-12-09 NOTE — Progress Notes (Signed)
 Assessment & Plan:  [name] was seen today for anxiety.  Diagnoses and all orders for this visit:  Anxiety Anxiety disorder managed with hydroxyzine  as needed, effectively aiding relaxation and sleep.  Elevated LFTs -     Lipid panel -     Hepatic Function Panel  Prediabetes -     Hemoglobin A1c  Increased urinary frequency Increased urinary frequency, particularly nocturia, possibly due to increased water intake. - Advise reducing fluid intake after 7 PM to manage nocturia. - Perform urinalysis to rule out other causes. Intermittent back and pelvic pain with no clear etiology and not associated with recent activity changes.   General Health Maintenance Routine health maintenance labs are due, including cholesterol, A1c for prediabetes, and liver enzymes. - Check cholesterol levels. - Check A1c for prediabetes. - Check liver enzymes.  Patient has been counseled on age-appropriate routine health concerns for screening and prevention. These are reviewed and up-to-date. Referrals have been placed accordingly. Immunizations are up-to-date or declined.    Subjective:   Chief Complaint  Patient presents with   Anxiety    [name] 38 y.o. female presents to office today for follow up to anxiety, increased urinary frequency and back pain.  At her last visit with me a few weeks ago she was experiencing increased anxiety and increased stressors. .  I prescribed her hydroxyzine  at that time. She is currently taking hydroxyzine  as needed, approximately three to four times a week, primarily to help with sleep and relaxation. She feels that the medication is effective when she uses it.  She experiences increased urinary frequency, particularly at night, and has been drinking more water recently. She typically stops drinking fluids around 8:00 to 8:30 PM and goes to bed between 9:00 and 9:40 PM. She is concerned about whether this frequency is normal.  She has  intermittent back pain over the past week, localized to the right side of her lower back into her  pelvis. No heavy lifting or changes in physical activity that could have contributed to the pain. 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       Review of Systems  Constitutional:  Negative for fever, malaise/fatigue and weight loss.  HENT: Negative.  Negative for nosebleeds.   Eyes: Negative.  Negative for blurred vision, double vision and photophobia.  Respiratory: Negative.  Negative for cough and shortness of breath.   Cardiovascular: Negative.  Negative for chest pain, palpitations and leg swelling.  Gastrointestinal: Negative.  Negative for heartburn, nausea and vomiting.  Musculoskeletal:  Positive for back pain. Negative for myalgias.  Neurological: Negative.  Negative for dizziness, focal weakness, seizures and headaches.  Psychiatric/Behavioral:  Negative for suicidal ideas. The patient is nervous/anxious.     Past Medical History:  Diagnosis Date   Dizziness    Medical history non-contributory     Past Surgical History:  Procedure Laterality Date   NO PAST SURGERIES      Family History  Problem Relation Age of Onset   Alcohol abuse Neg Hx     Social History Reviewed with no changes to be made today.   Outpatient Medications Prior to Visit  Medication Sig Dispense Refill   cholecalciferol (VITAMIN D3) 25 MCG (1000 UNIT) tablet Take 1,000 Units by mouth daily.     hydrOXYzine  (ATARAX ) 10 MG tablet Take 1 tablet (10 mg total) by mouth 3 (three) times daily  as needed. For anxiety 60 tablet 0   levonorgestrel -ethinyl estradiol  (ALTAVERA ) 0.15-30 MG-MCG tablet Take 1 tablet by mouth daily. 84 tablet 3   cyclobenzaprine  (FLEXERIL ) 10 MG tablet Take 1 tablet (10 mg total) by mouth 2 (two) times daily as needed for muscle spasms. (Patient not taking: Reported on 12/09/2023) 60  tablet 1   meclizine  (ANTIVERT ) 25 MG tablet Take 1 tablet (25 mg total) by mouth 3 (three) times daily as needed for dizziness. (Patient not taking: Reported on 12/09/2023) 30 tablet 3   meloxicam  (MOBIC ) 15 MG tablet Take 1 tablet (15 mg total) by mouth daily as needed for pain. (Patient not taking: Reported on 12/09/2023) 30 tablet 0   Vitamin D , Ergocalciferol , (DRISDOL ) 1.25 MG (50000 UNIT) CAPS capsule Take 1 capsule (50,000 Units total) by mouth every 7 (seven) days. (Patient not taking: Reported on 12/09/2023) 12 capsule 0   No facility-administered medications prior to visit.    No Known Allergies     Objective:    BP 105/69 (BP Location: Left Arm, Patient Position: Sitting, Cuff Size: Normal)   Pulse 63   Resp 19   Ht 5' 1 (1.549 m)   Wt 153 lb 6.4 oz (69.6 kg)   LMP 11/13/2023 (Approximate)   SpO2 98%   BMI 28.98 kg/m  Wt Readings from Last 3 Encounters:  12/09/23 153 lb 6.4 oz (69.6 kg)  11/13/23 150 lb 6.4 oz (68.2 kg)  11/06/23 156 lb 11.2 oz (71.1 kg)    Physical Exam Vitals and nursing note reviewed.  Constitutional:      Appearance: She is well-developed.  HENT:     Head: Normocephalic and atraumatic.  Cardiovascular:     Rate and Rhythm: Normal rate and regular rhythm.     Heart sounds: Normal heart sounds. No murmur heard.    No friction rub. No gallop.  Pulmonary:     Effort: Pulmonary effort is normal. No tachypnea or respiratory distress.     Breath sounds: Normal breath sounds. No decreased breath sounds, wheezing, rhonchi or rales.  Chest:     Chest wall: No tenderness.  Musculoskeletal:        General: Normal range of motion.     Cervical back: Normal range of motion.  Skin:    General: Skin is warm and dry.  Neurological:     Mental Status: She is alert and oriented to person, place, and time.     Coordination: Coordination normal.  Psychiatric:        Behavior: Behavior normal. Behavior is cooperative.        Thought Content: Thought  content normal.        Judgment: Judgment normal.          Patient has been counseled extensively about nutrition and exercise as well as the importance of adherence with medications and regular follow-up. The patient was given clear instructions to go to ER or return to medical center if symptoms don't improve, worsen or new problems develop. The patient verbalized understanding.   Follow-up: Return in about 3 months (around 03/09/2024).   Haze LELON Servant, FNP-BC [facility] and [facility] [place], [place] [phone]   12/09/2023, 1:11 PM

## 2023-12-09 NOTE — Progress Notes (Signed)
Unable to reach patient by phone.  Voicemail left. 

## 2023-12-10 LAB — HEPATIC FUNCTION PANEL
ALT: 83 IU/L — ABNORMAL HIGH (ref 0–32)
AST: 47 IU/L — ABNORMAL HIGH (ref 0–40)
Albumin: 4.3 g/dL (ref 3.9–4.9)
Alkaline Phosphatase: 64 IU/L (ref 41–116)
Bilirubin Total: 0.3 mg/dL (ref 0.0–1.2)
Bilirubin, Direct: 0.09 mg/dL (ref 0.00–0.40)
Total Protein: 7.3 g/dL (ref 6.0–8.5)

## 2023-12-10 LAB — LIPID PANEL
Chol/HDL Ratio: 4.9 ratio — ABNORMAL HIGH (ref 0.0–4.4)
Cholesterol, Total: 201 mg/dL — ABNORMAL HIGH (ref 100–199)
HDL: 41 mg/dL (ref >39)
LDL Chol Calc (NIH): 129 mg/dL — ABNORMAL HIGH (ref 0–99)
Triglycerides: 173 mg/dL — ABNORMAL HIGH (ref 0–149)
VLDL Cholesterol Cal: 31 mg/dL (ref 5–40)

## 2023-12-10 LAB — HEMOGLOBIN A1C
Est. average glucose Bld gHb Est-mCnc: 114 mg/dL
Hgb A1c MFr Bld: 5.6 % (ref 4.8–5.6)

## 2023-12-12 ENCOUNTER — Ambulatory Visit: Payer: Self-pay | Admitting: Nurse Practitioner

## 2024-01-27 NOTE — Progress Notes (Deleted)
 Office Visit Note  Patient: [name]             Date of Birth: 03-Apr-1985           MRN: [id number]             PCP: Theotis Haze ORN, NP Referring: Theotis Haze ORN, NP Visit Date: 02/06/2024   Subjective:  No chief complaint on file.   History of Present Illness: [name] is a 38 y.o. female here for follow up with elevated liver enzymes and inflammation markers.   Previous HPI 11/06/2023 [name] is a 38 year old female who presents with elevated liver enzymes and inflammation markers.   She has experienced elevated liver enzyme levels, specifically AST and ALT, over the past year. Initially, the levels were 80 and 140 two years ago, but they have risen to 133 and 228, respectively. No significant alcohol consumption is reported. She avoids anti-inflammatory medications like meloxicam , ibuprofen , and acetaminophen .   She experiences occasional abdominal pain, described as a dull ache radiating from the side to the back. No history of gallstones is reported. The pain is not severe, with no cramping or sharp sensations.   She has frequent headaches and blurry vision, persisting most of the day. Her eyes are watery, with a sensation of dryness. No specific timeline is provided for these symptoms.   No gastrointestinal symptoms such as constipation or diarrhea are reported. No joint swelling is noted, and her joint condition remains unchanged.   Her complement C3 levels have increased from slightly above normal to significantly elevated.       Previous HPI 09/27/23 [name] is a 38 year old female who presents with joint pain and abnormal liver enzyme tests. She was referred by another doctor due to abnormal test results, including elevated liver enzymes and a positive ANA test.   She has been experiencing joint pain for more than five months, particularly affecting her elbow, knee, and leg. The pain is more pronounced at  night and when standing or climbing stairs. 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 She describes the pain as 'purple and sometimes swelling,' and notes that it has been ongoing for years but has become more uncomfortable recently. She uses ibuprofen  for pain relief, but finds it not very effective.   She experiences swelling in her hands, particularly when cooking or using a knife, which causes her hands to swell within five minutes. No circulation problems such as changes in color of her fingers or toes are noted. She experiences numbness in her hands, particularly at night, which sometimes wakes her up. No dropping things or needing to shake her hands to relieve the numbness.   She reports experiencing skin rashes that are red and slightly swollen, which have been present for about a month. No mouth ulcers or sores.   She denies any significant alcohol consumption, stating she only drinks wine occasionally on weekends.      No Rheumatology ROS completed.   PMFS History:  Patient Active Problem List   Diagnosis Date Noted   Abnormal liver enzymes 11/06/2023   Positive ANA (antinuclear antibody) 09/27/2023   Patellar tendonitis of both knees 09/27/2023   Carpal tunnel syndrome, right upper limb 09/27/2023   Bunion of great toe of right foot 09/27/2023   Endometriosis determined by laparoscopy 02/13/2017   Pregnancy 12/26/2013   NVD (normal vaginal delivery) 12/26/2013   Fall 11/27/2013    Past  Medical History:  Diagnosis Date   Dizziness    Medical history non-contributory     Family History  Problem Relation Age of Onset   Alcohol abuse Neg Hx    Past Surgical History:  Procedure Laterality Date   NO PAST SURGERIES     Social History   Social History Narrative   Not on file   Immunization History  Administered Date(s) Administered   Influenza, Seasonal, Injecte, Preservative Fre 12/17/2022, 11/13/2023   Influenza,inj,Quad PF,6+ Mos 12/27/2013,  02/04/2019, 02/18/2020, 06/09/2021, 03/21/2022   Tdap 12/27/2013     Objective: Vital Signs: There were no vitals taken for this visit.   Physical Exam   Musculoskeletal Exam: ***  CDAI Exam: CDAI Score: -- Patient Global: --; Provider Global: -- Swollen: --; Tender: -- Joint Exam 02/06/2024   No joint exam has been documented for this visit   There is currently no information documented on the homunculus. Go to the Rheumatology activity and complete the homunculus joint exam.  Investigation: No additional findings.  Imaging: No results found.  Recent Labs: Lab Results  Component Value Date   WBC 8.4 09/27/2023   HGB 15.0 09/27/2023   PLT 363 09/27/2023   NA 139 09/27/2023   K 4.6 09/27/2023   CL 105 09/27/2023   CO2 22 09/27/2023   GLUCOSE 91 09/27/2023   BUN 12 09/27/2023   CREATININE 0.89 09/27/2023   BILITOT 0.3 12/09/2023   ALKPHOS 64 12/09/2023   AST 47 (H) 12/09/2023   ALT 83 (H) 12/09/2023   PROT 7.3 12/09/2023   ALBUMIN 4.3 12/09/2023   CALCIUM 9.2 09/27/2023   GFRAA 107 04/13/2020    Speciality Comments: No specialty comments available.  Procedures:  No procedures performed Allergies: Patient has no known allergies.   Assessment / Plan:     Visit Diagnoses: No diagnosis found.  ***  Orders: No orders of the defined types were placed in this encounter.  No orders of the defined types were placed in this encounter.    Follow-Up Instructions: No follow-ups on file.   Tanya Crothers M Mell Guia, CMA  Note - This record has been created using Animal nutritionist.  Chart creation errors have been sought, but may not always  have been located. Such creation errors do not reflect on  the standard of medical care.

## 2024-02-04 ENCOUNTER — Other Ambulatory Visit: Payer: Self-pay | Admitting: Nurse Practitioner

## 2024-02-04 ENCOUNTER — Other Ambulatory Visit: Payer: Self-pay

## 2024-02-04 DIAGNOSIS — Z3041 Encounter for surveillance of contraceptive pills: Secondary | ICD-10-CM

## 2024-02-04 MED ORDER — NORETHINDRONE 0.35 MG PO TABS
1.0000 | ORAL_TABLET | Freq: Every day | ORAL | 11 refills | Status: AC
  Filled 2024-02-04: qty 28, 28d supply, fill #0
  Filled 2024-03-04: qty 28, 28d supply, fill #1
  Filled 2024-04-02: qty 28, 28d supply, fill #2

## 2024-02-05 ENCOUNTER — Other Ambulatory Visit: Payer: Self-pay

## 2024-02-06 ENCOUNTER — Ambulatory Visit: Admitting: Internal Medicine

## 2024-02-06 DIAGNOSIS — R748 Abnormal levels of other serum enzymes: Secondary | ICD-10-CM

## 2024-02-06 DIAGNOSIS — R7689 Other specified abnormal immunological findings in serum: Secondary | ICD-10-CM

## 2024-02-10 ENCOUNTER — Other Ambulatory Visit: Payer: Self-pay

## 2024-03-04 ENCOUNTER — Other Ambulatory Visit: Payer: Self-pay

## 2024-03-06 ENCOUNTER — Other Ambulatory Visit: Payer: Self-pay

## 2024-03-10 ENCOUNTER — Ambulatory Visit: Payer: Self-pay | Admitting: Nurse Practitioner

## 2024-04-02 ENCOUNTER — Other Ambulatory Visit: Payer: Self-pay

## 2024-04-02 ENCOUNTER — Other Ambulatory Visit: Payer: Self-pay | Admitting: Family Medicine

## 2024-04-02 DIAGNOSIS — R10A1 Flank pain, right side: Secondary | ICD-10-CM

## 2024-04-02 MED ORDER — CYCLOBENZAPRINE HCL 10 MG PO TABS
10.0000 mg | ORAL_TABLET | Freq: Two times a day (BID) | ORAL | 0 refills | Status: AC | PRN
  Filled 2024-04-02: qty 60, 30d supply, fill #0

## 2024-04-08 NOTE — Progress Notes (Unsigned)
 "  Office Visit Note  Patient: [name]             Date of Birth: Jun 22, 1985           MRN: [id number]             PCP: Theotis Haze ORN, NP Referring: Theotis Haze ORN, NP Visit Date: 04/21/2024   Subjective:  Joint Pain (Knees and elbows )   History of Present Illness: [name] is a 39 y.o. female here for follow up who presents with elevated liver enzymes and inflammation markers.   Discussed the use of AI scribe software for clinical note transcription with the patient, who gave verbal consent to proceed.  History of Present Illness   [name] is a 39 year old female who presents with dizziness upon standing from a low position.  She experiences dizziness and lightheadedness when standing up from a low position, such as picking something up from the floor. The sensation is described as 'black blurring' in her head that lasts for about one minute. These episodes have been occurring intermittently since last year, with a significant episode that required assistance for daily activities. No dizziness with other movements such as rolling over or turning her head, and no associated nausea or vision changes beyond the blurring. She has not experienced any falls or loss of consciousness during these episodes.  She reports that her liver enzymes have been elevated on previous tests. She reports that an ultrasound of her liver was performed in September and was described to her as normal. She denies current liver pain. She recalls a past episode of significant dizziness that required her to stay home for several days.  She reports joint pain, particularly in the knees, which worsens with repetitive activities such as climbing stairs or kneeling. The pain is more intense in the front part of the knee and is exacerbated by her work activities, which involve frequent bending and lifting. She has not been taking any medication for this pain.  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 No swelling in legs except during pregnancy.       Previous HPI 11/06/2023 [name] is a 39 year old female who presents with elevated liver enzymes and inflammation markers.   She has experienced elevated liver enzyme levels, specifically AST and ALT, over the past year. Initially, the levels were 80 and 140 two years ago, but they have risen to 133 and 228, respectively. No significant alcohol consumption is reported. She avoids anti-inflammatory medications like meloxicam , ibuprofen , and acetaminophen .   She experiences occasional abdominal pain, described as a dull ache radiating from the side to the back. No history of gallstones is reported. The pain is not severe, with no cramping or sharp sensations.   She has frequent headaches and blurry vision, persisting most of the day. Her eyes are watery, with a sensation of dryness. No specific timeline is provided for these symptoms.   No gastrointestinal symptoms such as constipation or diarrhea are reported. No joint swelling is noted, and her joint condition remains unchanged.   Her complement C3 levels have increased from slightly above normal to significantly elevated.       Previous HPI 09/27/23 [name] is a 39 year old female who presents with joint pain and abnormal liver enzyme tests. She was referred by another doctor due to abnormal test results, including elevated liver enzymes and a  positive ANA test.   She has been experiencing joint pain for more than five months, particularly affecting her elbow, knee, and leg. The pain is more pronounced at night and when standing or climbing stairs. 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 She describes the pain as 'purple and sometimes swelling,' and notes that it has been ongoing for years but has become more uncomfortable recently. She uses ibuprofen  for pain relief,  but finds it not very effective.   She experiences swelling in her hands, particularly when cooking or using a knife, which causes her hands to swell within five minutes. No circulation problems such as changes in color of her fingers or toes are noted. She experiences numbness in her hands, particularly at night, which sometimes wakes her up. No dropping things or needing to shake her hands to relieve the numbness.   She reports experiencing skin rashes that are red and slightly swollen, which have been present for about a month. No mouth ulcers or sores.   She denies any significant alcohol consumption, stating she only drinks wine occasionally on weekends.    Review of Systems  Constitutional:  Negative for fatigue.  HENT:  Negative for mouth sores and mouth dryness.   Eyes:  Positive for dryness.  Respiratory:  Negative for shortness of breath.   Cardiovascular:  Negative for chest pain and palpitations.  Gastrointestinal:  Negative for blood in stool, constipation and diarrhea.  Endocrine: Negative for increased urination.  Genitourinary:  Negative for involuntary urination.  Musculoskeletal:  Positive for joint pain, joint pain, morning stiffness and muscle tenderness. Negative for gait problem, joint swelling, myalgias, muscle weakness and myalgias.  Skin:  Positive for hair loss. Negative for color change, rash and sensitivity to sunlight.  Allergic/Immunologic: Negative for susceptible to infections.  Neurological:  Positive for dizziness and headaches.  Hematological:  Negative for swollen glands.  Psychiatric/Behavioral:  Negative for depressed mood and sleep disturbance. The patient is nervous/anxious.     PMFS History:  Patient Active Problem List   Diagnosis Date Noted   Abnormal liver enzymes 11/06/2023   Positive ANA (antinuclear antibody) 09/27/2023   Patellar tendonitis of both knees 09/27/2023   Carpal tunnel syndrome, right upper limb 09/27/2023   Bunion of  great toe of right foot 09/27/2023   Endometriosis determined by laparoscopy 02/13/2017   Pregnancy 12/26/2013   NVD (normal vaginal delivery) 12/26/2013   Fall 11/27/2013    Past Medical History:  Diagnosis Date   Dizziness    Medical history non-contributory     Family History  Problem Relation Age of Onset   Alcohol abuse Neg Hx    Past Surgical History:  Procedure Laterality Date   NO PAST SURGERIES     Social History   Social History Narrative   Not on file   Immunization History  Administered Date(s) Administered   Influenza, Seasonal, Injecte, Preservative Fre 12/17/2022, 11/13/2023   Influenza,inj,Quad PF,6+ Mos 12/27/2013, 02/04/2019, 02/18/2020, 06/09/2021, 03/21/2022   Tdap 12/27/2013     Objective: Vital Signs: BP 106/62   Pulse 73   Temp 98.5 F (36.9 C)   Resp 16   Ht 5' 1 (1.549 m)   Wt 142 lb 8 oz (64.6 kg)   BMI 26.93 kg/m    Physical Exam   Musculoskeletal Exam: ***  Left knee medial pain with patellar movement   Investigation: No additional findings.  Imaging: No results found.  Recent Labs: Lab Results  Component Value Date  WBC 8.4 09/27/2023   HGB 15.0 09/27/2023   PLT 363 09/27/2023   NA 139 09/27/2023   K 4.6 09/27/2023   CL 105 09/27/2023   CO2 22 09/27/2023   GLUCOSE 91 09/27/2023   BUN 12 09/27/2023   CREATININE 0.89 09/27/2023   BILITOT 0.3 12/09/2023   ALKPHOS 64 12/09/2023   AST 47 (H) 12/09/2023   ALT 83 (H) 12/09/2023   PROT 7.3 12/09/2023   ALBUMIN 4.3 12/09/2023   CALCIUM 9.2 09/27/2023   GFRAA 107 04/13/2020    Speciality Comments: No specialty comments available.  Procedures:  No procedures performed Allergies: Patient has no known allergies.   Assessment / Plan:     Visit Diagnoses:  Assessment & Plan Positive ANA (antinuclear antibody)     Abnormal liver enzymes      ***  Assessment and Plan    Orthostatic hypotension Intermittent dizziness and lightheadedness upon  standing suggest orthostatic hypotension, possibly due to delayed reflexes in blood pressure regulation. Differential includes vagus nerve involvement. No current need for medication as symptoms are not severe. - Provided information on orthostatic hypotension. - Advised on increasing salt and fluid intake if symptoms worsen. - Will consider cardiology referral if symptoms become more frequent or severe.  Patellofemoral pain syndrome Pain in the front of the knee, likely use-related due to work activities. No need for medical intervention at this time. - Provided information on patellofemoral pain syndrome. - Recommended exercises to strengthen leg muscles. - Suggested use of flexible knee braces for support. - Advised use of topical Voltaren  gel as needed for pain relief.  Abnormal liver enzymes Mildly elevated liver enzymes likely due to nonalcoholic fatty liver disease. No structural abnormalities noted. No immediate intervention required unless enzyme levels significantly increase. - Continue to monitor liver enzyme levels with primary care follow-up. - Advised on lifestyle modifications if overweight or high blood sugar is present. - Will consider gastroenterology referral if liver enzymes remain elevated.        Follow-Up Instructions: Return if symptoms worsen or fail to improve.   Lonni LELON Ester, MD  Note - This record has been created using [employer].  Chart creation errors have been sought, but may not always  have been located. Such creation errors do not reflect on  the standard of medical care. "

## 2024-04-09 NOTE — Assessment & Plan Note (Addendum)
 [name]

## 2024-04-09 NOTE — Assessment & Plan Note (Addendum)
 SABRA

## 2024-04-13 ENCOUNTER — Ambulatory Visit: Payer: Self-pay | Admitting: Nurse Practitioner

## 2024-04-21 ENCOUNTER — Ambulatory Visit: Payer: Self-pay | Admitting: Internal Medicine

## 2024-04-21 ENCOUNTER — Encounter: Payer: Self-pay | Admitting: Internal Medicine

## 2024-04-21 VITALS — BP 106/62 | HR 73 | Temp 98.5°F | Resp 16 | Ht 61.0 in | Wt 142.5 lb

## 2024-04-21 DIAGNOSIS — R748 Abnormal levels of other serum enzymes: Secondary | ICD-10-CM

## 2024-04-21 DIAGNOSIS — R7689 Other specified abnormal immunological findings in serum: Secondary | ICD-10-CM

## 2024-04-29 ENCOUNTER — Ambulatory Visit: Payer: Self-pay | Admitting: Nurse Practitioner

## 2024-07-21 ENCOUNTER — Ambulatory Visit: Admitting: Internal Medicine
